# Patient Record
Sex: Female | Born: 1975 | Race: White | Hispanic: No | Marital: Married | Smoking: Never smoker
Health system: Southern US, Community
[De-identification: ages and names within clinical notes are randomized; demographics above are authoritative.]

## PROBLEM LIST (undated history)

## (undated) DIAGNOSIS — R011 Cardiac murmur, unspecified: Secondary | ICD-10-CM

## (undated) DIAGNOSIS — F419 Anxiety disorder, unspecified: Secondary | ICD-10-CM

## (undated) DIAGNOSIS — M1990 Unspecified osteoarthritis, unspecified site: Secondary | ICD-10-CM

## (undated) HISTORY — DX: Anxiety disorder, unspecified: F41.9

## (undated) HISTORY — DX: Cardiac murmur, unspecified: R01.1

## (undated) HISTORY — DX: Unspecified osteoarthritis, unspecified site: M19.90

---

## 2000-06-04 ENCOUNTER — Other Ambulatory Visit: Admission: RE | Admit: 2000-06-04 | Discharge: 2000-06-04 | Payer: Self-pay | Admitting: Gynecology

## 2001-05-28 ENCOUNTER — Other Ambulatory Visit: Admission: RE | Admit: 2001-05-28 | Discharge: 2001-05-28 | Payer: Self-pay | Admitting: Gynecology

## 2002-06-03 ENCOUNTER — Other Ambulatory Visit: Admission: RE | Admit: 2002-06-03 | Discharge: 2002-06-03 | Payer: Self-pay | Admitting: Gynecology

## 2003-09-14 ENCOUNTER — Other Ambulatory Visit: Admission: RE | Admit: 2003-09-14 | Discharge: 2003-09-14 | Payer: Self-pay | Admitting: Gynecology

## 2003-11-13 ENCOUNTER — Ambulatory Visit (HOSPITAL_COMMUNITY): Admission: RE | Admit: 2003-11-13 | Discharge: 2003-11-13 | Payer: Self-pay | Admitting: Gynecology

## 2005-01-30 ENCOUNTER — Other Ambulatory Visit: Admission: RE | Admit: 2005-01-30 | Discharge: 2005-01-30 | Payer: Self-pay | Admitting: Gynecology

## 2005-09-22 ENCOUNTER — Inpatient Hospital Stay (HOSPITAL_COMMUNITY): Admission: AD | Admit: 2005-09-22 | Discharge: 2005-09-22 | Payer: Self-pay | Admitting: Gynecology

## 2005-09-22 ENCOUNTER — Ambulatory Visit: Payer: Self-pay | Admitting: Obstetrics and Gynecology

## 2005-09-23 ENCOUNTER — Inpatient Hospital Stay (HOSPITAL_COMMUNITY): Admission: AD | Admit: 2005-09-23 | Discharge: 2005-09-23 | Payer: Self-pay | Admitting: Gynecology

## 2005-09-25 ENCOUNTER — Ambulatory Visit: Payer: Self-pay | Admitting: *Deleted

## 2005-09-29 ENCOUNTER — Ambulatory Visit: Payer: Self-pay | Admitting: *Deleted

## 2005-10-02 ENCOUNTER — Ambulatory Visit: Payer: Self-pay | Admitting: *Deleted

## 2005-10-06 ENCOUNTER — Encounter (INDEPENDENT_AMBULATORY_CARE_PROVIDER_SITE_OTHER): Payer: Self-pay | Admitting: *Deleted

## 2005-10-06 ENCOUNTER — Ambulatory Visit: Payer: Self-pay | Admitting: Obstetrics & Gynecology

## 2005-10-06 ENCOUNTER — Inpatient Hospital Stay (HOSPITAL_COMMUNITY): Admission: AD | Admit: 2005-10-06 | Discharge: 2005-10-10 | Payer: Self-pay | Admitting: Gynecology

## 2005-11-17 ENCOUNTER — Other Ambulatory Visit: Admission: RE | Admit: 2005-11-17 | Discharge: 2005-11-17 | Payer: Self-pay | Admitting: Gynecology

## 2006-12-04 ENCOUNTER — Other Ambulatory Visit: Admission: RE | Admit: 2006-12-04 | Discharge: 2006-12-04 | Payer: Self-pay | Admitting: Gynecology

## 2006-12-28 ENCOUNTER — Encounter: Admission: RE | Admit: 2006-12-28 | Discharge: 2006-12-28 | Payer: Self-pay | Admitting: Gastroenterology

## 2007-09-09 ENCOUNTER — Encounter: Admission: RE | Admit: 2007-09-09 | Discharge: 2007-09-09 | Payer: Self-pay | Admitting: Gastroenterology

## 2008-02-25 ENCOUNTER — Other Ambulatory Visit: Admission: RE | Admit: 2008-02-25 | Discharge: 2008-02-25 | Payer: Self-pay | Admitting: Gynecology

## 2008-12-14 IMAGING — CT CT ABDOMEN W/ CM
2 of 5 series · 17 of 46 positions shown, 19 images · IV contrast (READICAT/WATER & [ID] OMNI 300)
Comparison: none

CLINICAL DATA: Abdominal pain. 
ABDOMEN CT WITH CONTRAST:
TECHNIQUE: Multidetector CT imaging of the abdomen was performed following the standard protocol during bolus administration of intravenous contrast.
Contrast:  100 cc Omnipaque 300

[Series 3: routine abdomen · axial · 0.70mm/px · z∈[-262,-7]mm · 14 of 59 slices shown, 16 images]
[im 4/59  soft-tissue]
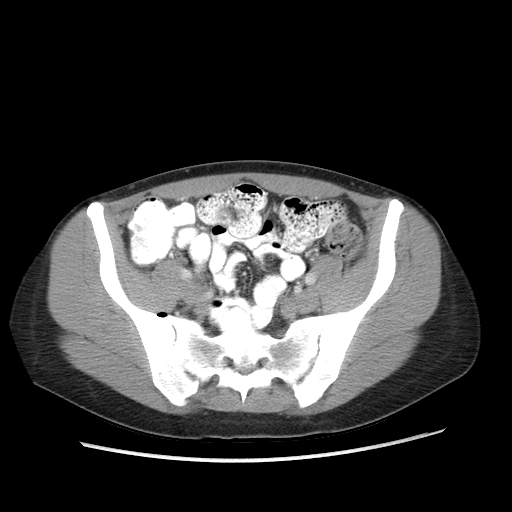
[im 4/59  bone]
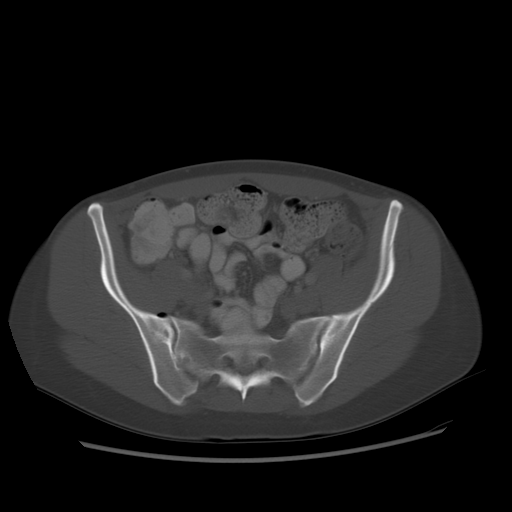
[im 7/59  soft-tissue]
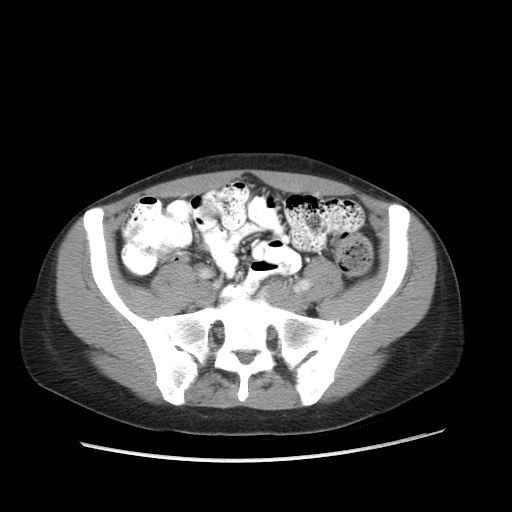
[im 11/59  soft-tissue]
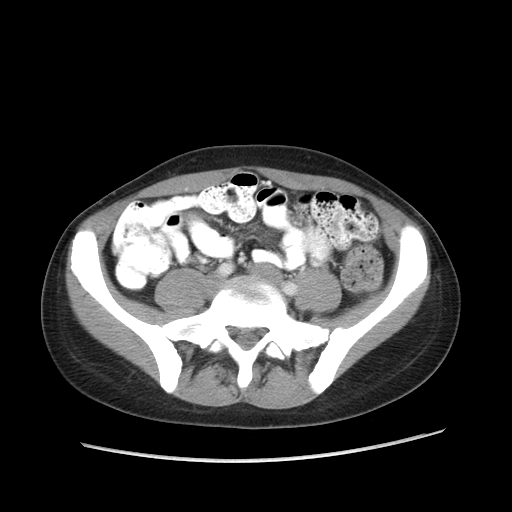
[im 18/59  soft-tissue]
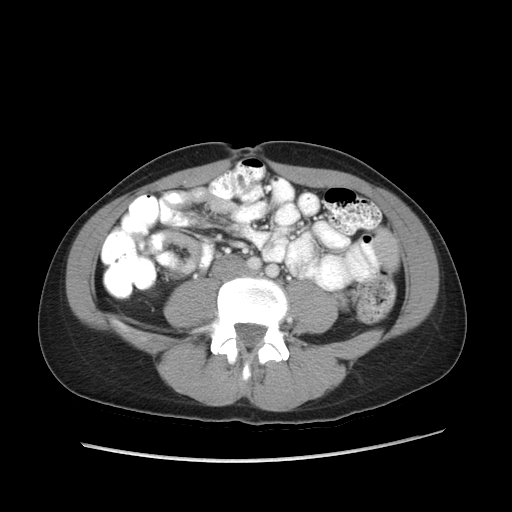
[im 21/59  soft-tissue]
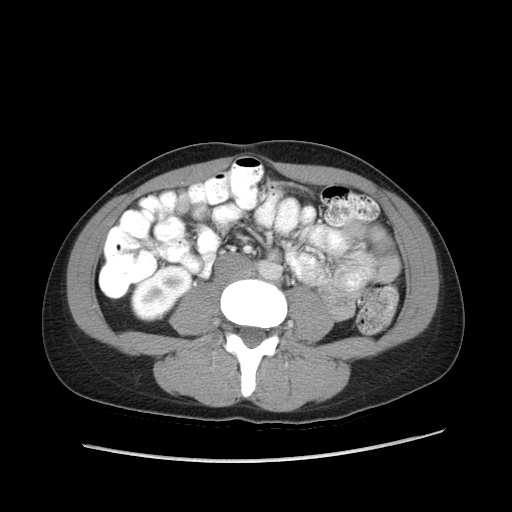
[im 24/59  soft-tissue]
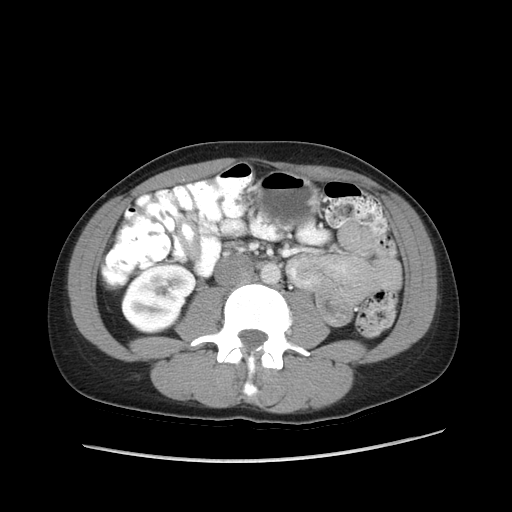
[im 28/59  soft-tissue]
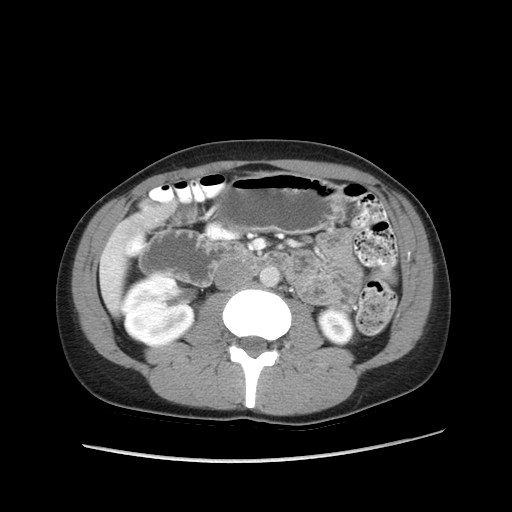
[im 31/59  soft-tissue]
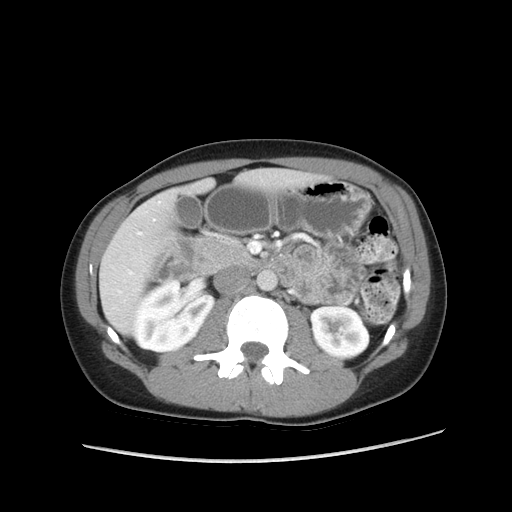
[im 35/59  soft-tissue]
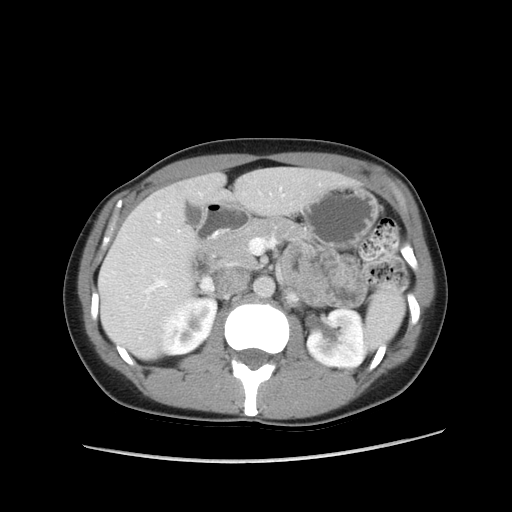
[im 35/59  bone]
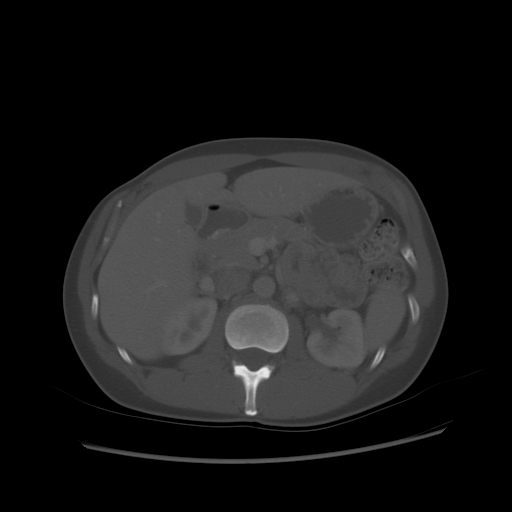
[im 38/59  soft-tissue]
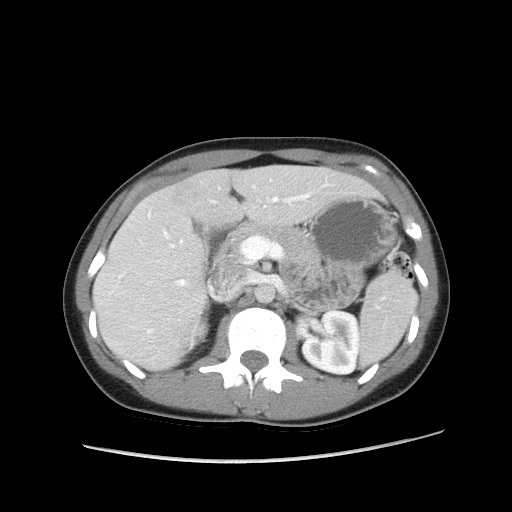
[im 45/59  soft-tissue]
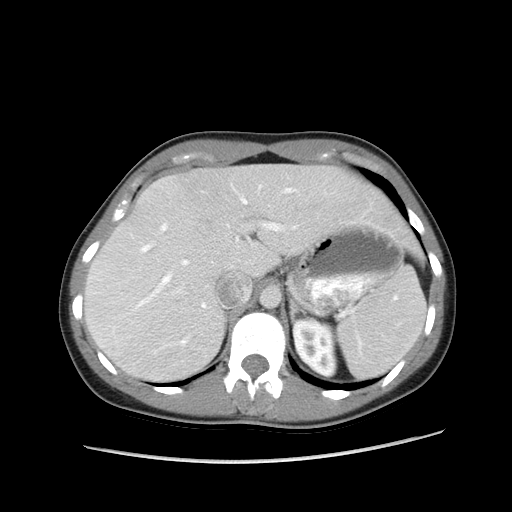
[im 48/59  soft-tissue]
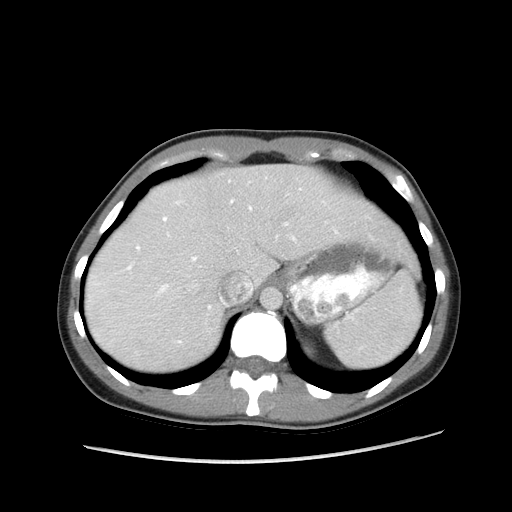
[im 52/59  soft-tissue]
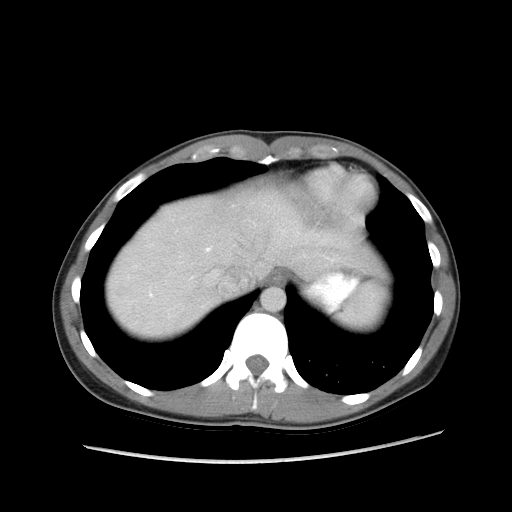
[im 55/59  soft-tissue]
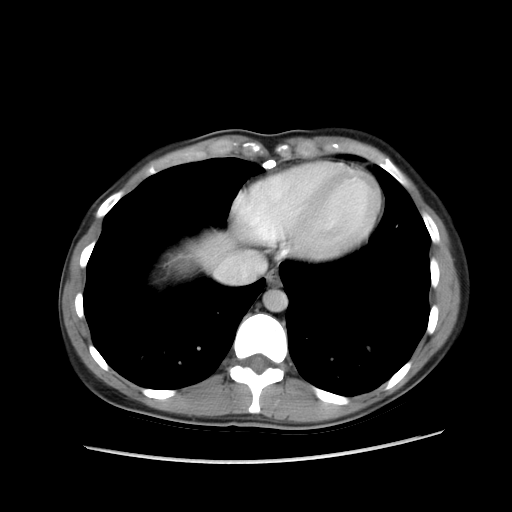

[Series 602: sagittal body · sagittal · 0.70mm/px · 3 of 138 slices shown]
[im 46/138  soft-tissue]
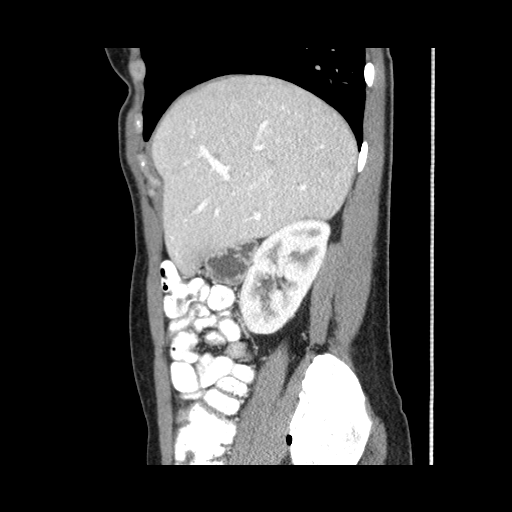
[im 61/138  soft-tissue]
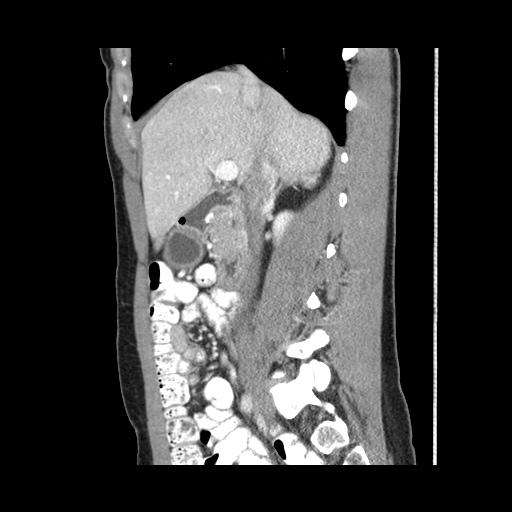
[im 77/138  soft-tissue]
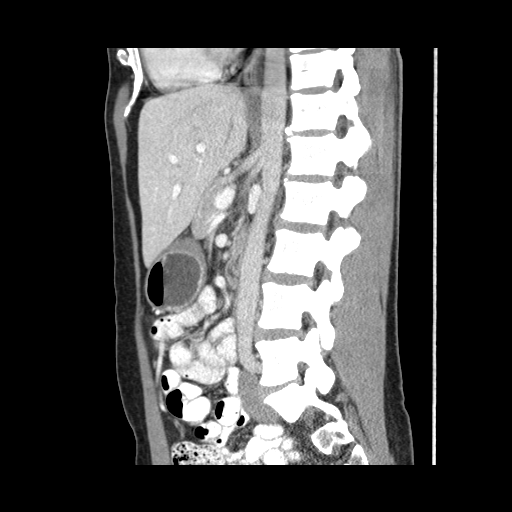

[17 of 46 positions shown; findings below may reference images not displayed]

FINDINGS: The lung bases are clear.  The liver enhances and there is only a single vague area of inhomogeneous enhancement in the left lobe anterior to the portal vein.  There appear to be vessels coursing through this region and I would favor this representing geographic fatty infiltration.  No abnormality was seen at this site by ultrasound in December 2006.  No calcified gallstones are seen.  Pancreas is normal in size and configuration and the pancreatic duct is not dilated.  The adrenal glands and spleen appear normal.  The duodenal loop is somewhat prominent as the duodenum courses behind the SMA and SMV, but I believe this is probably related to peristaltic motion.  The kidneys enhance and on delayed images the pelvocaliceal systems appear normal.  The abdominal aorta is normal in caliber.  No bony abnormality is seen.
IMPRESSION: Negative CT of the abdomen.  There is a vague area of lower attenuation in the left lobe of liver that has vessels coursing through it, most consistent with  focal fatty infiltration.

## 2009-03-06 ENCOUNTER — Encounter: Payer: Self-pay | Admitting: Gynecology

## 2009-03-06 ENCOUNTER — Ambulatory Visit: Payer: Self-pay | Admitting: Gynecology

## 2009-03-06 ENCOUNTER — Other Ambulatory Visit: Admission: RE | Admit: 2009-03-06 | Discharge: 2009-03-06 | Payer: Self-pay | Admitting: Gynecology

## 2009-04-27 ENCOUNTER — Ambulatory Visit: Payer: Self-pay | Admitting: Gynecology

## 2010-03-13 ENCOUNTER — Ambulatory Visit: Payer: Self-pay | Admitting: Gynecology

## 2010-03-13 ENCOUNTER — Other Ambulatory Visit: Admission: RE | Admit: 2010-03-13 | Discharge: 2010-03-13 | Payer: Self-pay | Admitting: Gynecology

## 2010-05-29 ENCOUNTER — Ambulatory Visit: Payer: Self-pay | Admitting: Internal Medicine

## 2010-05-29 ENCOUNTER — Ambulatory Visit (HOSPITAL_COMMUNITY): Admission: RE | Admit: 2010-05-29 | Discharge: 2010-05-29 | Payer: Self-pay | Admitting: Cardiology

## 2010-05-29 ENCOUNTER — Encounter (INDEPENDENT_AMBULATORY_CARE_PROVIDER_SITE_OTHER): Payer: Self-pay | Admitting: *Deleted

## 2010-11-12 ENCOUNTER — Other Ambulatory Visit: Payer: Self-pay | Admitting: Dermatology

## 2011-03-14 NOTE — H&P (Signed)
NAMECHRISSA, MEETZE               ACCOUNT NO.:  [id number]   MEDICAL RECORD NO.:  [id number]           PATIENT TYPE:   LOCATION:                                 FACILITY:   PHYSICIAN:  Juan H. Lily Peer, M.D.     DATE OF BIRTH:   DATE OF ADMISSION:  DATE OF DISCHARGE:                                HISTORY & PHYSICAL   HISTORY:  The patient is a 35 year old gravida 1, para 0 at 32-6/[redacted] weeks  gestation.  Was sent to [facility] for NST secondary to the fact that  she has twin gestation.  NST on both twins were reactive but patient was  found to be contracting every six to eight minutes apart.  She was given IV  fluids consisting of 500 mL bolus of LR followed by 200 mL/hour and she was  given terbutaline 0.25 subcutaneous.  The second dose was administered  approximately an hour later with subsided contractions, although some  irritability was still present.  Good reactivity on both twins.  Her cervix  was long, closed, and posterior.  Urinalysis was negative.  Her blood  pressure was 115/78, pulse 67, temperature 98.6, and respirations were 18.  Pelvic examination:  Her cervix was long, closed, and posterior.  Patient  will be receiving a dose of betamethasone 12.5 mg IM now and to be repeated  again in 24 hours in the event of early delivery before [redacted] weeks gestation.  Patient will be started on terbutaline 2.5 mg p.o. q.4h. and has a follow-up  appointment in a few days in the office.  Labor instructions were provided.      Juan H. Lily Peer, M.D.  Electronically Signed     JHF/MEDQ  D:  09/22/2005  T:  09/22/2005  Job:  [id number]

## 2011-03-14 NOTE — Op Note (Signed)
[name], [name]               ACCOUNT NO.:  [id number]   MEDICAL RECORD NO.:  [id number]          PATIENT TYPE:  INP   LOCATION:  9111                          [facility]ILITY[facility]   PHYSICIAN:  Ivor Costa. Farrel Gobble, M.D. DATE OF BIRTH:  1976/01/30   DATE OF PROCEDURE:  10/06/2005  DATE OF DISCHARGE:                                 OPERATIVE REPORT   PREOPERATIVE DIAGNOSES:  1.  Twin gestation at 35+ weeks.  2.  Early preeclampsia.  3.  Unfavorable cervix.   POSTOPERATIVE DIAGNOSES:  1.  Twin gestation at 35+ weeks.  2.  Early preeclampsia.  3.  Unfavorable cervix.   OPERATION/PROCEDURE:  Primary cesarean section, low flap, transverse.   SURGEON:  Ivor Costa. Farrel Gobble, M.D.   ANESTHESIA:  Spinal.   FLUIDS REPLACED:  1100 mL lactated Ringer's.   ESTIMATED BLOOD LOSS:  1000 mL.   URINE OUTPUT:  150 mL clear urine.   FINDINGS:  Viable female in the vertex presentation, Apgars 9 and 9, birth  weight 4 pounds 10 ounces.  Baby B was a viable female in the vertex  presentation, Apgars of 9 and 9, birth weight 5 pounds 5 ounces.  Clear  amniotic fluid.  Normal uterus, tubes and ovaries.   COMPLICATIONS:  None.   PATHOLOGY:  Placenta with a clamp on A.   DESCRIPTION OF PROCEDURE:  The patient was taken to the operating room,  spinal anesthesia was induced.  The patient was placed in the supine  position with left lateral displacement, prepped and draped in the usual  sterile fashion.  After adequate anesthesia was insured, a Pfannenstiel skin  incision was made with scalpel and carried to the underlying layer of fascia  with electrocautery.  The fascia was scored in the midline.  Incision was  extended laterally with the Bovie.  The inferior aspect of the fascial  incision was grasped with the Kochers.  Underlying rectus muscles were  dissected off with blunt and sharp dissection.  In similar fashion, the  superior aspect of incision was grasped with Kochers and the rectus muscles  were separated off.  The rectus muscles were naturally separated in the  midline.  The peritoneum was identified and entered sharply.  The peritoneal  incision was entered superiorly and inferiorly.  Good visualization of the  underlying bowel and bladder.  The orientation of the uterus is confirmed.  The bladder blade was reinserted and the vesicouterine peritoneum was  identified, tented up and entered sharply with the Metzenbaum scissors.  Bladder flap was created digitally.  Bladder blade was then inserted and the  lower uterine segment was incised in a transverse fashion with the scalpel.  Clear amniotic fluid was noted upon entering the cavity.  The incision was  extended bluntly.  Baby A was brought to the incision and delivered with the  aid of Baby Elliots for a viable female.  Cord was cut and clamped and the  infant handed off to the waiting pediatricians after which the vertex of the  baby was brought into the incision and amniotomy was performed and similarly  was delivered with the aid of Baby Elliots.  Cord was clamped and cut.  Cord  bloods were obtained.  The uterus was massaged and placenta was allowed to  separate naturally.  The uterus was then cleared of all clots and debris.  The uterine incision was repaired with running locked layer of 0 chromic and  a second suture was used for imbrication.  The pelvis was then irrigated  with copious amounts of warm saline.  The adnexa were inspected and noted to  be unremarkable.  The peritoneum, fascia and muscle were also inspected and  noted to be hemostatic.  Fascia was then closed with 0 Vicryl in a running  fashion.  Subcutaneous was irrigated and reapproximated with 2-0 plain.  The  skin was closed with 4-0 Vicryl on the [place].  Dermabond was placed over the  incision.  The patient tolerated the procedure well.  Sponge, lap and needle  counts were correct x2.  She was transferred to the PACU in stable  condition.       Ivor Costa. Farrel Gobble, M.D.  Electronically Signed     THL/MEDQ  D:  10/06/2005  T:  10/07/2005  Job:  [id number]

## 2011-03-14 NOTE — H&P (Signed)
[name], [name]               ACCOUNT NO.:  [id number]   MEDICAL RECORD NO.:  [id number]           PATIENT TYPE:   LOCATION:                                 FACILITY:   PHYSICIAN:  Ivor Costa. Farrel Gobble, M.D.      DATE OF BIRTH:   DATE OF ADMISSION:  DATE OF DISCHARGE:                                HISTORY & PHYSICAL   CHIEF COMPLAINT:  Elevated blood pressure at 35 plus weeks with twins.   HISTORY OF PRESENT ILLNESS:  The patient is a 35 year old G1 with an  estimated date of confinement of November 09, 2004, estimated gestational age  of 73 and 2/7ths weeks, who has been followed with NSTs biweekly for preterm  labor, as well as twin gestation.  The patient had presented today to the  hospital without any complaints, and was noted to have a blood pressure of  136/95 and a follow up of 155/100.  Upon presentation to the office today,  her blood pressure is 140/100 with a follow up of 150/100.  She is without  any complaints.  She has no headaches, visual changes, nausea, vomiting,  epigastric pain, and no extremity swelling.  The patient does report some  contractions.  Her pregnancy is complicated by twin pregnancy, for which she  has a female and female with good interval growth.  The last ultrasound was  done about a week ago which shows baby A to be in the 27th percentile with  an estimated weight of 2144, and baby B to be in the 30th percentile with an  estimated weight of 2254, with normal fluid pocket for both.  Refer to the  [place].  She is A positive, antibody negative, RPR nonreactive, rubella  negative, hepatitis B surface antigen non-reactive, HIV nonreactive.  GBS  was done today.  Refer to the [place].   PHYSICAL EXAMINATION:  GENERAL:  She is a well-appearing gravida in no acute  distress.  VITAL SIGNS:  Her blood pressure was 150/100.  HEART:  Regular rate.  LUNGS:  Clear to auscultation.  ABDOMEN:  Gravid, soft, and nontender.  VAGINAL EXAM:  She was  fingertip, soft, and posterior.  EXTREMITIES:  No edema, no clonus, and normal reflexes.   ASSESSMENT:  Thirty-five plus weeks with elevated blood pressure.  We will  go ahead and check PIH labs at [facility] and plan for delivery today.  All questions were addressed.      Ivor Costa. Farrel Gobble, M.D.  Electronically Signed     THL/MEDQ  D:  10/06/2005  T:  10/06/2005  Job:  [id number]

## 2011-03-14 NOTE — Discharge Summary (Signed)
[name], [name]               ACCOUNT NO.:  [id number]   MEDICAL RECORD NO.:  [id number]          PATIENT TYPE:  INP   LOCATION:                                [facility]   PHYSICIAN:  Timothy P. Fontaine, M.D.DATE OF BIRTH:  [birth year]   DATE OF ADMISSION:  10/06/2005  DATE OF DISCHARGE:  10/10/2005                                 DISCHARGE SUMMARY   DISCHARGE DIAGNOSES:  1.  Twin gestation 35 weeks, delivered.  2.  Preeclampsia.  3.  Unfavorable cervix.   PROCEDURE:  Primary low transverse cervical cesarean section October 06, 2005, Dr. Douglass Rivers.   HOSPITAL COURSE:  A 35 year old, G1 P0, at 43 weeks' twin gestation, who  enters with elevated blood pressures.  The patient was noted to have a blood  pressures in the 136/95 to 155/100 range and on further evaluation was also  found to have proteinuria.  The patient's exam was unfavorable.  Discussions  for serial induction versus primary cesarean section were reviewed and the  patient elected for cesarean section.  The patient underwent an  uncomplicated primary cesarean section, October 06, 2005, producing twin A  normal female, Apgars 9 and 9, weight 4 pounds 10 ounces.  Twin B normal  female, Apgars 9 and 9, weight 5 pounds 5 ounces.  The patient's postoperative  course was uncomplicated, and she was discharged on postoperative day #4,  ambulating well, tolerating a regular diet with a postoperative hemoglobin  of 9.6.  The patient's blood pressures were noted to normalize following  delivery in the 110 over 60-70 range.  The patient was given  precautions/instructions and follow-up.   The patient will be seen in the office in six weeks following delivery.   Received a prescription for Tylox, #30, one to two p.o. every six hours  p.r.n. pain.   The patient's rubella titer is negative.  She is instructed to receive a  rubella vaccine before discharge.  Her blood type is A positive.      Timothy P. Fontaine,  M.D.  Electronically Signed     TPF/MEDQ  D:  10/10/2005  T:  10/11/2005  Job:  [id number]

## 2011-03-20 ENCOUNTER — Other Ambulatory Visit: Payer: Self-pay | Admitting: Gynecology

## 2011-03-20 ENCOUNTER — Encounter (INDEPENDENT_AMBULATORY_CARE_PROVIDER_SITE_OTHER): Payer: 59 | Admitting: Gynecology

## 2011-03-20 ENCOUNTER — Other Ambulatory Visit (HOSPITAL_COMMUNITY)
Admission: RE | Admit: 2011-03-20 | Discharge: 2011-03-20 | Disposition: A | Discharge status: Home / Self Care | Payer: 59 | Source: Ambulatory Visit | Attending: Gynecology | Admitting: Gynecology

## 2011-03-20 DIAGNOSIS — Z13220 Encounter for screening for lipoid disorders: Secondary | ICD-10-CM

## 2011-03-20 DIAGNOSIS — Z01419 Encounter for gynecological examination (general) (routine) without abnormal findings: Secondary | ICD-10-CM

## 2011-03-20 DIAGNOSIS — Z124 Encounter for screening for malignant neoplasm of cervix: Secondary | ICD-10-CM | POA: Insufficient documentation

## 2011-03-27 ENCOUNTER — Ambulatory Visit (INDEPENDENT_AMBULATORY_CARE_PROVIDER_SITE_OTHER): Payer: 59 | Admitting: Gynecology

## 2011-03-27 DIAGNOSIS — Z30431 Encounter for routine checking of intrauterine contraceptive device: Secondary | ICD-10-CM

## 2011-04-25 ENCOUNTER — Ambulatory Visit (INDEPENDENT_AMBULATORY_CARE_PROVIDER_SITE_OTHER): Payer: 59 | Admitting: Gynecology

## 2011-04-25 DIAGNOSIS — Z30431 Encounter for routine checking of intrauterine contraceptive device: Secondary | ICD-10-CM

## 2012-12-28 ENCOUNTER — Other Ambulatory Visit: Payer: Self-pay | Admitting: Dermatology

## 2013-05-03 ENCOUNTER — Other Ambulatory Visit: Payer: Self-pay | Admitting: Dermatology

## 2013-06-20 ENCOUNTER — Ambulatory Visit (INDEPENDENT_AMBULATORY_CARE_PROVIDER_SITE_OTHER): Payer: BC Managed Care – PPO | Admitting: Women's Health

## 2013-06-20 ENCOUNTER — Encounter: Payer: Self-pay | Admitting: Women's Health

## 2013-06-20 ENCOUNTER — Other Ambulatory Visit (HOSPITAL_COMMUNITY)
Admission: RE | Admit: 2013-06-20 | Discharge: 2013-06-20 | Disposition: A | Discharge status: Home / Self Care | Payer: BC Managed Care – PPO | Source: Ambulatory Visit | Attending: Gynecology | Admitting: Gynecology

## 2013-06-20 VITALS — BP 114/74 | Ht 66.0 in | Wt 132.0 lb

## 2013-06-20 DIAGNOSIS — E079 Disorder of thyroid, unspecified: Secondary | ICD-10-CM

## 2013-06-20 DIAGNOSIS — Z13220 Encounter for screening for lipoid disorders: Secondary | ICD-10-CM

## 2013-06-20 DIAGNOSIS — Z01419 Encounter for gynecological examination (general) (routine) without abnormal findings: Secondary | ICD-10-CM | POA: Insufficient documentation

## 2013-06-20 DIAGNOSIS — Z975 Presence of (intrauterine) contraceptive device: Secondary | ICD-10-CM

## 2013-06-20 LAB — CBC WITH DIFFERENTIAL/PLATELET
Basophils Relative: 1 % (ref 0–1)
Eosinophils Absolute: 0.1 10*3/uL (ref 0.0–0.7)
Eosinophils Relative: 1 % (ref 0–5)
MCH: 29.8 pg (ref 26.0–34.0)
MCHC: 34.2 g/dL (ref 30.0–36.0)
MCV: 87.1 fL (ref 78.0–100.0)
Monocytes Relative: 6 % (ref 3–12)
Neutrophils Relative %: 55 % (ref 43–77)
Platelets: 324 10*3/uL (ref 150–400)

## 2013-06-20 NOTE — Progress Notes (Signed)
[name] [birth year] [id number]    History:    The patient presents for annual exam.  Mirena IUD placed 02/2011 with minimal bleeding. Normal Pap history. Zoloft 25 with good relief of anxiety/depression symptoms.   Past medical history, past surgical history, family history and social history were all reviewed and documented in the EPIC chart. 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 Twins [place] and [place] 7-1/2 and doing well. Chyrl Civatte has questionable endocrine problem with precocious puberty. Parents hypertension. Paternal aunt breast cancer.   ROS:  A  ROS was performed and pertinent positives and negatives are included in the history.  Exam:  Filed Vitals:   06/20/13 1520  BP: 114/74    General appearance:  Normal Head/Neck:  Normal, without cervical or supraclavicular adenopathy. Thyroid:  Symmetrical, normal in size, without palpable masses or nodularity. Respiratory  Effort:  Normal  Auscultation:  Clear without wheezing or rhonchi Cardiovascular  Auscultation:  Regular rate, without rubs, murmurs or gallops  Edema/varicosities:  Not grossly evident Abdominal  Soft,nontender, without masses, guarding or rebound.  Liver/spleen:  No organomegaly noted  Hernia:  None appreciated  Skin  Inspection:  Grossly normal  Palpation:  Grossly normal Neurologic/psychiatric  Orientation:  Normal with appropriate conversation.  Mood/affect:  Normal  Genitourinary    Breasts: Examined lying and sitting.     Right: Without masses, retractions, discharge or axillary adenopathy.     Left: Without masses, retractions, discharge or axillary adenopathy.   Inguinal/mons:  Normal without inguinal adenopathy  External genitalia:  Normal  BUS/Urethra/Skene's glands:  Normal  Bladder:  Normal  Vagina:  Normal  Cervix:  Normal IUD strings visible  Uterus:  normal in size, shape and contour.  Midline and mobile  Adnexa/parametria:     Rt: Without masses or  tenderness.   Lt: Without masses or tenderness.  Anus and perineum: Normal  Digital rectal exam: Normal sphincter tone without palpated masses or tenderness  Assessment/Plan:  37 y.o. M. WF G1 P2 for annual exam with no complaints.  Mirena IUD 02/2011 Depression stable on Zoloft 25 mg  Plan:.   Zoloft 25, prescription, proper use given and reviewed, reviewed very low-dose states keep symptoms manageable. Denies need for counseling at this time. SBE's, screening mammogram, annual screen at 38 reviewed. Continue regular exercise, calcium rich diet, vitamin D 1000 daily. CBC, TSH, lipid panel, UA, Pap. Pap normal 2012, new screening guidelines reviewed.Marland Kitchen    Harrington Challenger [facility], 5:12 PM 06/20/2013

## 2013-06-20 NOTE — Patient Instructions (Signed)

## 2013-06-21 LAB — LIPID PANEL
Cholesterol: 155 mg/dL (ref 0–200)
Total CHOL/HDL Ratio: 3.2 Ratio
VLDL: 25 mg/dL (ref 0–40)

## 2013-06-21 LAB — URINALYSIS W MICROSCOPIC + REFLEX CULTURE
Bilirubin Urine: NEGATIVE
Crystals: NONE SEEN
Leukocytes, UA: NEGATIVE
Protein, ur: NEGATIVE mg/dL
Specific Gravity, Urine: 1.01 (ref 1.005–1.030)
Squamous Epithelial / LPF: NONE SEEN
Urobilinogen, UA: 0.2 mg/dL (ref 0.0–1.0)

## 2013-06-28 ENCOUNTER — Encounter: Payer: Self-pay | Admitting: Gynecology

## 2014-02-23 ENCOUNTER — Encounter: Payer: Self-pay | Admitting: Sports Medicine

## 2014-02-23 ENCOUNTER — Ambulatory Visit (INDEPENDENT_AMBULATORY_CARE_PROVIDER_SITE_OTHER): Payer: Managed Care, Other (non HMO) | Admitting: Sports Medicine

## 2014-02-23 VITALS — BP 105/71 | HR 52 | Ht 66.0 in | Wt 130.0 lb

## 2014-02-23 DIAGNOSIS — M76899 Other specified enthesopathies of unspecified lower limb, excluding foot: Secondary | ICD-10-CM

## 2014-02-23 DIAGNOSIS — M7070 Other bursitis of hip, unspecified hip: Secondary | ICD-10-CM

## 2014-02-24 ENCOUNTER — Encounter: Payer: Self-pay | Admitting: Sports Medicine

## 2014-02-24 NOTE — Progress Notes (Signed)
   Subjective:    Patient ID: [name], female    DOB: 1976/07/03, 38 y.o.   MRN: [id number]  HPI chief complaint: Right hip pain  Very pleasant 38 year old female comes in today complaining of 2-3 years of right hip pain. No trauma that she can recall but a rather sudden onset of pain that initially began after running. She believes this was back in either 2012 or 2013. She was evaluated by a local orthopedist who ordered an MRI of her right hip without contrast. That MRI suggested a possible labral tear. However, the patient tells me that she was given an injection in her lateral hip for bursitis. Her pain initially was diffuse in the hip and the lateral hip pain did improve with this injection but she had persistent groin pain. This eventually led to an MRI arthrogram of her hip done in December of 2014. That study showed no evidence of a labral tear nor was there any evidence of femoral acetabular impingement or significant degenerative changes. Main finding was some fluid in the right iliopsoas bursa. At the time of the MRI arthrogram, an intra-articular cortisone injection was also administered but this provided her with absolutely no pain relief (not even initially). She is continue to struggle with hip pain with activity. She has been unable to arrive or bike without pain. Pain is present at the end of activity as well as the following day. She describes a pulling and catching sensation deep within the right groin. She gets similar pain in the left hip as well although not as severe. She denies low back pain. She denies radiating pain down the leg. She denies any numbness or tingling. No prior hip surgeries. Both MRIs are available for my review today. In addition to the cortisone injections the patient did undergo some initial physical therapy.  Past medical history reviewed. She is otherwise healthy. No known drug allergies Denies alcohol or tobacco use    Review of Systems       Objective:   Physical Exam Well-developed, well-nourished. No acute distress. Awake alert and oriented x3. Vital signs reviewed.  Right hip: Smooth painless hip range of motion with a negative log roll. Pain with FADIR. No pain with FABER. There is no bony or soft tissue tenderness to direct palpation. Negative Thomas test. Good hamstring flexibility but a tight IT band. Bilateral hip abductor weakness. Negative straight leg raise. No focal neurological deficits of either lower extremity. Patient has about a centimeter leg length discrepancy, left leg shorter than the right. Evaluation of her running form shows a neutral foot strike but dynamic genu valgus. She is able to run without a limp.  MRI scans are as above. I personally reviewed the MRI arthrogram.       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 I've also given her some IT band stretches and crossover stretches. Patient is asking about what types of activities she should try. I recommended that she initially try swimming and elliptical but I also reassured her that there is no structural damage in her hip so all of her activity is limited by pain only. She can use prn NSAIDs. I've also given her a 3/16 inch heel lift for her shorter left leg and she will followup with me in 4 weeks. If she continues to struggle, I may consider a brief return back to formal physical therapy.

## 2014-03-15 ENCOUNTER — Ambulatory Visit: Payer: 59 | Admitting: Sports Medicine

## 2014-03-27 ENCOUNTER — Ambulatory Visit: Payer: Managed Care, Other (non HMO) | Admitting: Sports Medicine

## 2014-04-10 ENCOUNTER — Ambulatory Visit (INDEPENDENT_AMBULATORY_CARE_PROVIDER_SITE_OTHER): Payer: Managed Care, Other (non HMO) | Admitting: Sports Medicine

## 2014-04-10 ENCOUNTER — Encounter: Payer: Self-pay | Admitting: Sports Medicine

## 2014-04-10 VITALS — BP 101/63 | Ht 67.0 in | Wt 135.0 lb

## 2014-04-10 DIAGNOSIS — M7070 Other bursitis of hip, unspecified hip: Secondary | ICD-10-CM

## 2014-04-10 DIAGNOSIS — M76899 Other specified enthesopathies of unspecified lower limb, excluding foot: Secondary | ICD-10-CM

## 2014-04-10 NOTE — Progress Notes (Signed)
   Subjective:    Patient ID: [name], female    DOB: 06/13/1976, 38 y.o.   MRN: [id number]  HPI Patient comes in today for followup on right hip iliopsoas bursitis. Still getting groin pain with impact exercise such as running. Some pain with swimming and biking as well. She has noticed some benefit with her 3/16 inch heel lift on the left and is requesting more of these. She has been compliant with her home exercises but notes that standing external rotation exercises will reproduce her groin pain.    Review of Systems     Objective:   Physical Exam Well-developed, well-nourished. No acute distress. Awake alert and oriented x3. Vital signs reviewed  Right hip: Still some pain with FADIR testing. Still with some hip abductor weakness as well. Neurovascularly intact distally. Walking without a limp.       Assessment & Plan:  Chronic right hip pain with MRI evidence of iliopsoas as strain/bursitis  I recommend a return to formal physical therapy, this time working with Loni Dolly' Halloran. I really think this is a biomechanical issue and I'm hoping that Jenny Reichmann can help her with this. I discussed the possibility of an iliopsoas bursa injection sometime in the next few weeks. In fact, patient will followup with me in 6 weeks but I've asked her to call me in 3-4 weeks with a progress update and we may consider getting the injection prior to her followup visit with me. I've also given her the name of [employer] to order more heel lifts.

## 2014-05-29 ENCOUNTER — Ambulatory Visit: Payer: Managed Care, Other (non HMO) | Admitting: Sports Medicine

## 2014-06-12 ENCOUNTER — Ambulatory Visit: Payer: Managed Care, Other (non HMO) | Admitting: Sports Medicine

## 2014-11-24 ENCOUNTER — Other Ambulatory Visit: Payer: Self-pay | Admitting: Dermatology

## 2015-04-02 ENCOUNTER — Other Ambulatory Visit: Payer: Self-pay

## 2015-04-02 DIAGNOSIS — Z1231 Encounter for screening mammogram for malignant neoplasm of breast: Secondary | ICD-10-CM

## 2015-04-09 ENCOUNTER — Ambulatory Visit
Admission: RE | Admit: 2015-04-09 | Discharge: 2015-04-09 | Disposition: A | Discharge status: Home / Self Care | Payer: Managed Care, Other (non HMO) | Source: Ambulatory Visit

## 2015-04-09 DIAGNOSIS — Z1231 Encounter for screening mammogram for malignant neoplasm of breast: Secondary | ICD-10-CM

## 2015-11-23 ENCOUNTER — Other Ambulatory Visit (HOSPITAL_COMMUNITY)
Admission: RE | Admit: 2015-11-23 | Discharge: 2015-11-23 | Disposition: A | Discharge status: Home / Self Care | Payer: BLUE CROSS/BLUE SHIELD | Source: Ambulatory Visit | Attending: Women's Health | Admitting: Women's Health

## 2015-11-23 ENCOUNTER — Ambulatory Visit (INDEPENDENT_AMBULATORY_CARE_PROVIDER_SITE_OTHER): Payer: BLUE CROSS/BLUE SHIELD | Admitting: Women's Health

## 2015-11-23 ENCOUNTER — Encounter: Payer: Self-pay | Admitting: Women's Health

## 2015-11-23 VITALS — BP 117/76 | Ht 66.0 in | Wt 129.2 lb

## 2015-11-23 DIAGNOSIS — E038 Other specified hypothyroidism: Secondary | ICD-10-CM | POA: Diagnosis not present

## 2015-11-23 DIAGNOSIS — Z01419 Encounter for gynecological examination (general) (routine) without abnormal findings: Secondary | ICD-10-CM | POA: Insufficient documentation

## 2015-11-23 DIAGNOSIS — Z23 Encounter for immunization: Secondary | ICD-10-CM | POA: Diagnosis not present

## 2015-11-23 DIAGNOSIS — Z13220 Encounter for screening for lipoid disorders: Secondary | ICD-10-CM

## 2015-11-23 DIAGNOSIS — Z1151 Encounter for screening for human papillomavirus (HPV): Secondary | ICD-10-CM | POA: Diagnosis not present

## 2015-11-23 LAB — LIPID PANEL
CHOL/HDL RATIO: 2.9 ratio (ref <=5.0)
Cholesterol: 170 mg/dL (ref 125–200)
HDL: 59 mg/dL (ref >=46)
LDL CALC: 98 mg/dL (ref <130)
Triglycerides: 64 mg/dL (ref <150)
VLDL: 13 mg/dL (ref <30)

## 2015-11-23 LAB — COMPREHENSIVE METABOLIC PANEL
ALK PHOS: 44 U/L (ref 33–115)
ALT: 14 U/L (ref 6–29)
AST: 15 U/L (ref 10–30)
Albumin: 4.5 g/dL (ref 3.6–5.1)
BILIRUBIN TOTAL: 0.6 mg/dL (ref 0.2–1.2)
BUN: 17 mg/dL (ref 7–25)
CALCIUM: 9.5 mg/dL (ref 8.6–10.2)
CO2: 26 mmol/L (ref 20–31)
Chloride: 101 mmol/L (ref 98–110)
Creat: 1.05 mg/dL (ref 0.50–1.10)
GLUCOSE: 72 mg/dL (ref 65–99)
POTASSIUM: 5.2 mmol/L (ref 3.5–5.3)
Sodium: 136 mmol/L (ref 135–146)
Total Protein: 6.9 g/dL (ref 6.1–8.1)

## 2015-11-23 LAB — CBC WITH DIFFERENTIAL/PLATELET
BASOS ABS: 0 10*3/uL (ref 0.0–0.1)
Basophils Relative: 0 % (ref 0–1)
EOS ABS: 0.1 10*3/uL (ref 0.0–0.7)
EOS PCT: 1 % (ref 0–5)
HEMATOCRIT: 44.1 % (ref 36.0–46.0)
Hemoglobin: 14.7 g/dL (ref 12.0–15.0)
Lymphocytes Relative: 34 % (ref 12–46)
Lymphs Abs: 2.6 10*3/uL (ref 0.7–4.0)
MCH: 29.8 pg (ref 26.0–34.0)
MCHC: 33.3 g/dL (ref 30.0–36.0)
MCV: 89.5 fL (ref 78.0–100.0)
MONO ABS: 0.5 10*3/uL (ref 0.1–1.0)
MPV: 9.7 fL (ref 8.6–12.4)
Monocytes Relative: 6 % (ref 3–12)
Neutro Abs: 4.5 10*3/uL (ref 1.7–7.7)
Neutrophils Relative %: 59 % (ref 43–77)
PLATELETS: 321 10*3/uL (ref 150–400)
RBC: 4.93 MIL/uL (ref 3.87–5.11)
RDW: 13.6 % (ref 11.5–15.5)
WBC: 7.6 10*3/uL (ref 4.0–10.5)

## 2015-11-23 LAB — TSH: TSH: 1.046 u[IU]/mL (ref 0.350–4.500)

## 2015-11-23 NOTE — Progress Notes (Signed)
ARNETHA SILVERTHORNE July 26, 1976 [id number]    History:    Presents for annual exam. Amenorrhea, Mirena placed 02/2011 and will be replaced next week by Dr. Phineas Real. Has had no cycles since on Mirena. Reports hands and feet get very cold and blanch white at times, hands can feel very tight, questions if she has Raynaud's syndrome. Normal PAP and Mammogram history.   Past medical history, past surgical history, family history and social history were all reviewed and documented in the EPIC chart.  Married. 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    ROS:  A ROS was performed and pertinent positives and negatives are included.  Exam:  Filed Vitals:   11/23/15 1030  BP: 117/76    General appearance:  Normal Thyroid:  Symmetrical, normal in size, without palpable masses or nodularity. Respiratory  Auscultation:  Clear without wheezing or rhonchi Cardiovascular  Auscultation:  Regular rate, without rubs, murmurs or gallops  Edema/varicosities:  Not grossly evident Abdominal  Soft,nontender, without masses, guarding or rebound.  Liver/spleen:  No organomegaly noted  Hernia:  None appreciated  Skin  Inspection:  Grossly normal   Breasts: Examined lying and sitting.     Right: Without masses, retractions, discharge or axillary adenopathy.     Left: Without masses, retractions, discharge or axillary adenopathy. Gentitourinary   Inguinal/mons:  Normal without inguinal adenopathy  External genitalia:  Normal  BUS/Urethra/Skene's glands:  Normal  Vagina:  Normal  Cervix:  Normal IUD strings visible  Uterus:  Retroverted, normal in size, shape and contour.  Midline and mobile  Adnexa/parametria:     Rt: Without masses or tenderness.   Lt: Without masses or tenderness.  Anus and perineum: Normal  Digital rectal exam: Normal sphincter tone without palpated masses or tenderness  Assessment/Plan:  40 y.o.   MWF G1 P2 for annual exam with no complaints.  Mirena IUD  02/2011-Amenorrhea Mild anxiety, dyspareunia Probable Raynaud's Chronic hip pain evaluated by Orthopedist    Plan:Marland Kitchen Mirena IUD removal/reinsertion next week. Reviewed slight risks of infection, hemorrhage and perforation. Continue SBE's and screening mammograms. Reviewed importance of relaxation, vaginal lubricants with intercourse.  Continue to wear gloves for warmth. Continue regular exercise, avoid exercises that aggravate hip joints, calcium rich diet, vitamin D 1000 daily. CBC, CMP, TSH, lipid panel, UA, Pap with HPV.  Flu vaccine.   Huel Cote [facility], 11:20 AM 11/23/2015

## 2015-11-23 NOTE — Patient Instructions (Addendum)
Raynaud Phenomenon Raynaud phenomenon is a condition that affects the blood vessels (arteries) that carry blood to your fingers and toes. The arteries that supply blood to your ears or the tip of your nose might also be affected. Raynaud phenomenon causes the arteries to temporarily narrow. As a result, the flow of blood to the affected areas is temporarily decreased. This usually occurs in response to cold temperatures or stress. During an attack, the skin in the affected areas turns white. You may also feel tingling or numbness in those areas. Attacks usually last for only a brief period, and then the blood flow to the area returns to normal. In most cases, Raynaud phenomenon does not cause serious health problems. CAUSES  For many people with this condition, the cause is not known. Raynaud phenomenon is sometimes associated with other diseases, such as scleroderma or lupus. RISK FACTORS Raynaud phenomenon can affect anyone, but it develops most often in people who are 65-60 years old. It affects more females than males. SIGNS AND SYMPTOMS Symptoms of Raynaud phenomenon may occur when you are exposed to cold temperatures or when you have emotional stress. The symptoms may last for a few minutes or up to several hours. They usually affect your fingers but may also affect your toes, ears, or the tip of your nose. Symptoms may include:  Changes in skin color. The skin in the affected areas will turn pale or white. The skin may then change from white to bluish to red as normal blood flow returns to the area.  Numbness, tingling, or pain in the affected areas. In severe cases, sores may develop in the affected areas.  DIAGNOSIS  Your health care provider will do a physical exam and take your medical history. You may be asked to put your hands in cold water to check for a reaction to cold temperature. Blood tests may be done to check for other diseases or conditions. Your health care provider may also  order a test to check the movement of blood through your arteries and veins (vascular ultrasound). TREATMENT  Treatment often involves making lifestyle changes and taking steps to control your exposure to cold temperatures. For more severe cases, medicine (calcium channel blockers) may be used to improve blood flow. Surgery is sometimes done to block the nerves that control the affected arteries, but this is rare. HOME CARE INSTRUCTIONS   Avoid exposure to cold by taking these steps:  If possible, stay indoors during cold weather.  When you go outside during cold weather, dress in layers and wear mittens, a hat, a scarf, and warm footwear.  Wear mittens or gloves when handling ice or frozen food.  Use holders for glasses or cans containing cold drinks.  Let warm water run for a while before taking a shower or bath.  Warm up the car before driving in cold weather.  If possible, avoid stressful and emotional situations. Exercise, meditation, and yoga may help you cope with stress. Biofeedback may be useful.  Do not use any tobacco products, including cigarettes, chewing tobacco, or electronic cigarettes. If you need help quitting, ask your health care provider.  Avoid secondhand smoke.  Limit your use of caffeine. Switch to decaffeinated coffee, tea, and soda. Avoid chocolate.  Wear loose fitting socks and comfortable, roomy shoes.  Avoid vibrating tools and machinery.  Take medicines only as directed by your health care provider. SEEK MEDICAL CARE IF:   Your discomfort becomes worse despite lifestyle changes.  You develop sores on  your fingers or toes that do not heal.  Your fingers or toes turn black.  You have breaks in the skin on your fingers or toes.  You have a fever.  You have pain or swelling in your joints.  You have a rash.  Your symptoms occur on only one side of your body.   This information is not intended to replace advice given to you by your health  care provider. Make sure you discuss any questions you have with your health care provider.   Document Released: 10/10/2000 Document Revised: 11/03/2014 Document Reviewed: 05/18/2014 Elsevier Interactive Patient Education 2016 [place] Maintenance, Female Adopting a healthy lifestyle and getting preventive care can go a long way to promote health and wellness. Talk with your health care provider about what schedule of regular examinations is right for you. This is a good chance for you to check in with your provider about disease prevention and staying healthy. In between checkups, there are plenty of things you can do on your own. Experts have done a lot of research about which lifestyle changes and preventive measures are most likely to keep you healthy. Ask your health care provider for more information. WEIGHT AND DIET  Eat a healthy diet  Be sure to include plenty of vegetables, fruits, low-fat dairy products, and lean protein.  Do not eat a lot of foods high in solid fats, added sugars, or salt.  Get regular exercise. This is one of the most important things you can do for your health.  Most adults should exercise for at least 150 minutes each week. The exercise should increase your heart rate and make you sweat (moderate-intensity exercise).  Most adults should also do strengthening exercises at least twice a week. This is in addition to the moderate-intensity exercise.  Maintain a healthy weight  Body mass index (BMI) is a measurement that can be used to identify possible weight problems. It estimates body fat based on height and weight. Your health care provider can help determine your BMI and help you achieve or maintain a healthy weight.  For females 38 years of age and older:   A BMI below 18.5 is considered underweight.  A BMI of 18.5 to 24.9 is normal.  A BMI of 25 to 29.9 is considered overweight.  A BMI of 30 and above is considered obese.  Watch levels  of cholesterol and blood lipids  You should start having your blood tested for lipids and cholesterol at 42 years of age, then have this test every 5 years.  You may need to have your cholesterol levels checked more often if:  Your lipid or cholesterol levels are high.  You are older than 42 years of age.  You are at high risk for heart disease.  CANCER SCREENING   Lung Cancer  Lung cancer screening is recommended for adults 21-30 years old who are at high risk for lung cancer because of a history of smoking.  A yearly low-dose CT scan of the lungs is recommended for people who:  Currently smoke.  Have quit within the past 15 years.  Have at least a 30-pack-year history of smoking. A pack year is smoking an average of one pack of cigarettes a day for 1 year.  Yearly screening should continue until it has been 15 years since you quit.  Yearly screening should stop if you develop a health problem that would prevent you from having lung cancer treatment.  Breast Cancer  Practice breast  self-awareness. This means understanding how your breasts normally appear and feel.  It also means doing regular breast self-exams. Let your health care provider know about any changes, no matter how small.  If you are in your 20s or 30s, you should have a clinical breast exam (CBE) by a health care provider every 1-3 years as part of a regular health exam.  If you are 51 or older, have a CBE every year. Also consider having a breast X-ray (mammogram) every year.  If you have a family history of breast cancer, talk to your health care provider about genetic screening.  If you are at high risk for breast cancer, talk to your health care provider about having an MRI and a mammogram every year.  Breast cancer gene (BRCA) assessment is recommended for women who have family members with BRCA-related cancers. BRCA-related cancers include:  Breast.  Ovarian.  Tubal.  Peritoneal  cancers.  Results of the assessment will determine the need for genetic counseling and BRCA1 and BRCA2 testing. Cervical Cancer Your health care provider may recommend that you be screened regularly for cancer of the pelvic organs (ovaries, uterus, and vagina). This screening involves a pelvic examination, including checking for microscopic changes to the surface of your cervix (Pap test). You may be encouraged to have this screening done every 3 years, beginning at age 80.  For women ages 77-65, health care providers may recommend pelvic exams and Pap testing every 3 years, or they may recommend the Pap and pelvic exam, combined with testing for human papilloma virus (HPV), every 5 years. Some types of HPV increase your risk of cervical cancer. Testing for HPV may also be done on women of any age with unclear Pap test results.  Other health care providers may not recommend any screening for nonpregnant women who are considered low risk for pelvic cancer and who do not have symptoms. Ask your health care provider if a screening pelvic exam is right for you.  If you have had past treatment for cervical cancer or a condition that could lead to cancer, you need Pap tests and screening for cancer for at least 20 years after your treatment. If Pap tests have been discontinued, your risk factors (such as having a new sexual partner) need to be reassessed to determine if screening should resume. Some women have medical problems that increase the chance of getting cervical cancer. In these cases, your health care provider may recommend more frequent screening and Pap tests. Colorectal Cancer  This type of cancer can be detected and often prevented.  Routine colorectal cancer screening usually begins at 42 years of age and continues through 42 years of age.  Your health care provider may recommend screening at an earlier age if you have risk factors for colon cancer.  Your health care provider may also  recommend using home test kits to check for hidden blood in the stool.  A small camera at the end of a tube can be used to examine your colon directly (sigmoidoscopy or colonoscopy). This is done to check for the earliest forms of colorectal cancer.  Routine screening usually begins at age 2.  Direct examination of the colon should be repeated every 5-10 years through 42 years of age. However, you may need to be screened more often if early forms of precancerous polyps or small growths are found. Skin Cancer  Check your skin from head to toe regularly.  Tell your health care provider about any new moles  or changes in moles, especially if there is a change in a mole's shape or color.  Also tell your health care provider if you have a mole that is larger than the size of a pencil eraser.  Always use sunscreen. Apply sunscreen liberally and repeatedly throughout the day.  Protect yourself by wearing long sleeves, pants, a wide-brimmed hat, and sunglasses whenever you are outside. HEART DISEASE, DIABETES, AND HIGH BLOOD PRESSURE   High blood pressure causes heart disease and increases the risk of stroke. High blood pressure is more likely to develop in:  People who have blood pressure in the high end of the normal range (130-139/85-89 mm Hg).  People who are overweight or obese.  People who are African American.  If you are 36-83 years of age, have your blood pressure checked every 3-5 years. If you are 45 years of age or older, have your blood pressure checked every year. You should have your blood pressure measured twice--once when you are at a hospital or clinic, and once when you are not at a hospital or clinic. Record the average of the two measurements. To check your blood pressure when you are not at a hospital or clinic, you can use:  An automated blood pressure machine at a pharmacy.  A home blood pressure monitor.  If you are between 60 years and 33 years old, ask your health  care provider if you should take aspirin to prevent strokes.  Have regular diabetes screenings. This involves taking a blood sample to check your fasting blood sugar level.  If you are at a normal weight and have a low risk for diabetes, have this test once every three years after 42 years of age.  If you are overweight and have a high risk for diabetes, consider being tested at a younger age or more often. PREVENTING INFECTION  Hepatitis B  If you have a higher risk for hepatitis B, you should be screened for this virus. You are considered at high risk for hepatitis B if:  You were born in a country where hepatitis B is common. Ask your health care provider which countries are considered high risk.  Your parents were born in a high-risk country, and you have not been immunized against hepatitis B (hepatitis B vaccine).  You have HIV or AIDS.  You use needles to inject street drugs.  You live with someone who has hepatitis B.  You have had sex with someone who has hepatitis B.  You get hemodialysis treatment.  You take certain medicines for conditions, including cancer, organ transplantation, and autoimmune conditions. Hepatitis C  Blood testing is recommended for:  Everyone born from [birth year] through 1967.  Anyone with known risk factors for hepatitis C. Sexually transmitted infections (STIs)  You should be screened for sexually transmitted infections (STIs) including gonorrhea and chlamydia if:  You are sexually active and are younger than 42 years of age.  You are older than 42 years of age and your health care provider tells you that you are at risk for this type of infection.  Your sexual activity has changed since you were last screened and you are at an increased risk for chlamydia or gonorrhea. Ask your health care provider if you are at risk.  If you do not have HIV, but are at risk, it may be recommended that you take a prescription medicine daily to prevent HIV  infection. This is called pre-exposure prophylaxis (PrEP). You are considered at risk if:  You are sexually active and do not regularly use condoms or know the HIV status of your partner(s).  You take drugs by injection.  You are sexually active with a partner who has HIV. Talk with your health care provider about whether you are at high risk of being infected with HIV. If you choose to begin PrEP, you should first be tested for HIV. You should then be tested every 3 months for as long as you are taking PrEP.  PREGNANCY   If you are premenopausal and you may become pregnant, ask your health care provider about preconception counseling.  If you may become pregnant, take 400 to 800 micrograms (mcg) of folic acid every day.  If you want to prevent pregnancy, talk to your health care provider about birth control (contraception). OSTEOPOROSIS AND MENOPAUSE   Osteoporosis is a disease in which the bones lose minerals and strength with aging. This can result in serious bone fractures. Your risk for osteoporosis can be identified using a bone density scan.  If you are 41 years of age or older, or if you are at risk for osteoporosis and fractures, ask your health care provider if you should be screened.  Ask your health care provider whether you should take a calcium or vitamin D supplement to lower your risk for osteoporosis.  Menopause may have certain physical symptoms and risks.  Hormone replacement therapy may reduce some of these symptoms and risks. Talk to your health care provider about whether hormone replacement therapy is right for you.  HOME CARE INSTRUCTIONS   Schedule regular health, dental, and eye exams.  Stay current with your immunizations.   Do not use any tobacco products including cigarettes, chewing tobacco, or electronic cigarettes.  If you are pregnant, do not drink alcohol.  If you are breastfeeding, limit how much and how often you drink alcohol.  Limit  alcohol intake to no more than 1 drink per day for nonpregnant women. One drink equals 12 ounces of beer, 5 ounces of wine, or 1 ounces of hard liquor.  Do not use street drugs.  Do not share needles.  Ask your health care provider for help if you need support or information about quitting drugs.  Tell your health care provider if you often feel depressed.  Tell your health care provider if you have ever been abused or do not feel safe at home.   This information is not intended to replace advice given to you by your health care provider. Make sure you discuss any questions you have with your health care provider.   Document Released: 04/28/2011 Document Revised: 11/03/2014 Document Reviewed: 09/14/2013 Elsevier Interactive Patient Education [employer].

## 2015-11-26 ENCOUNTER — Telehealth: Payer: Self-pay | Admitting: Gynecology

## 2015-11-26 LAB — CYTOLOGY - PAP

## 2015-11-26 NOTE — Telephone Encounter (Signed)
11/26/15-I spoke w/pt today to let her know that her [facility] will cover the replacement of her Mirena for contraception at 100%, no copay. Per Carly at [employer]

## 2015-11-28 ENCOUNTER — Other Ambulatory Visit: Payer: Self-pay | Admitting: Women's Health

## 2015-11-28 MED ORDER — FLUCONAZOLE 150 MG PO TABS: 150.0000 mg | ORAL_TABLET | Freq: Once | ORAL | Status: DC

## 2015-11-30 ENCOUNTER — Encounter: Payer: Self-pay | Admitting: Gynecology

## 2015-11-30 ENCOUNTER — Ambulatory Visit (INDEPENDENT_AMBULATORY_CARE_PROVIDER_SITE_OTHER): Payer: BLUE CROSS/BLUE SHIELD | Admitting: Gynecology

## 2015-11-30 VITALS — BP 110/70

## 2015-11-30 DIAGNOSIS — Z30433 Encounter for removal and reinsertion of intrauterine contraceptive device: Secondary | ICD-10-CM

## 2015-11-30 HISTORY — PX: INTRAUTERINE DEVICE (IUD) INSERTION: SHX5877

## 2015-11-30 NOTE — Progress Notes (Signed)
Patient presents for Mirena IUD removal and replacement. She has read through the booklet, has no contraindications and signed the consent form.  I reviewed the removal and insertional process with her as well as the risks to include infection, either immediate or long-term, uterine perforation or migration requiring surgery to remove, other complications such as pain, hormonal side effects, infertility and possibility of failure with subsequent pregnancy.   Exam with [name] assistant Pelvic: External BUS vagina normal. Cervix normal with IUD string visualized. Uterus anteverted normal size shape contour midline mobile nontender. Adnexa without masses or tenderness.  Procedure: The cervix visualized with a speculum and the old Mirena IUD string was grasped with the [facility] forcep and the IUD was removed, shown to the patient and discarded. The cervix was cleansed with Betadine, anterior lip grasped with a single-tooth tenaculum, the uterus was sounded and a Mirena IUD was placed according to manufacturer's recommendations without difficulty. The strings were trimmed. The patient tolerated well and will follow up in one month for a postinsertional check.  Lot number:  [id number]    Anastasio Auerbach MD, 2:48 PM 11/30/2015

## 2015-11-30 NOTE — Patient Instructions (Signed)
Intrauterine Device Insertion Most often, an intrauterine device (IUD) is inserted into the uterus to prevent pregnancy. There are 2 types of IUDs available:  Copper IUD--This type of IUD creates an environment that is not favorable to sperm survival. The mechanism of action of the copper IUD is not known for certain. It can stay in place for 10 years.  Hormone IUD--This type of IUD contains the hormone progestin (synthetic progesterone). The progestin thickens the cervical mucus and prevents sperm from entering the uterus, and it also thins the uterine lining. There is no evidence that the hormone IUD prevents implantation. One hormone IUD can stay in place for up to 5 years, and a different hormone IUD can stay in place for up to 3 years. An IUD is the most cost-effective birth control if left in place for the full duration. It may be removed at any time. LET [facility] CARE PROVIDER KNOW ABOUT:  Any allergies you have.  All medicines you are taking, including vitamins, herbs, eye drops, creams, and over-the-counter medicines.  Previous problems you or members of your family have had with the use of anesthetics.  Any blood disorders you have.  Previous surgeries you have had.  Possibility of pregnancy.  Medical conditions you have. RISKS AND COMPLICATIONS  Generally, intrauterine device insertion is a safe procedure. However, as with any procedure, complications can occur. Possible complications include:  Accidental puncture (perforation) of the uterus.  Accidental placement of the IUD either in the muscle layer of the uterus (myometrium) or outside the uterus. If this happens, the IUD can be found essentially floating around the bowels and must be taken out surgically.  The IUD may fall out of the uterus (expulsion). This is more common in women who have recently had a child.   Pregnancy in the fallopian tube (ectopic).  Pelvic inflammatory disease (PID), which is infection of  the uterus and fallopian tubes. The risk of PID is slightly increased in the first 20 days after the IUD is placed, but the overall risk is still very low. BEFORE THE PROCEDURE  Schedule the IUD insertion for when you will have your menstrual period or right after, to make sure you are not pregnant. Placement of the IUD is better tolerated shortly after a menstrual cycle.  You may need to take tests or be examined to make sure you are not pregnant.  You may be required to take a pregnancy test.  You may be required to get checked for sexually transmitted infections (STIs) prior to placement. Placing an IUD in someone who has an infection can make the infection worse.  You may be given a pain reliever to take 1 or 2 hours before the procedure.  An exam will be performed to determine the size and position of your uterus.  Ask your health care provider about changing or stopping your regular medicines. PROCEDURE   A tool (speculum) is placed in the vagina. This allows your health care provider to see the lower part of the uterus (cervix).  The cervix is prepped with a medicine that lowers the risk of infection.  You may be given a medicine to numb each side of the cervix (intracervical or paracervical block). This is used to block and control any discomfort with insertion.  A tool (uterine sound) is inserted into the uterus to determine the length of the uterine cavity and the direction the uterus may be tilted.  A slim instrument (IUD inserter) is inserted through the cervical  canal and into your uterus.  The IUD is placed in the uterine cavity and the insertion device is removed.  The nylon string that is attached to the IUD and used for eventual IUD removal is trimmed. It is trimmed so that it lays high in the vagina, just outside the cervix. AFTER THE PROCEDURE  You may have bleeding after the procedure. This is normal. It varies from light spotting for a few days to menstrual-like  bleeding.  You may have mild cramping.   This information is not intended to replace advice given to you by your health care provider. Make sure you discuss any questions you have with your health care provider.   Document Released: 06/11/2011 Document Revised: 08/03/2013 Document Reviewed: 04/03/2013 Elsevier Interactive Patient Education [employer].

## 2015-12-03 ENCOUNTER — Encounter: Payer: Self-pay | Admitting: Gynecology

## 2015-12-28 ENCOUNTER — Encounter: Payer: Self-pay | Admitting: Gynecology

## 2015-12-28 ENCOUNTER — Ambulatory Visit (INDEPENDENT_AMBULATORY_CARE_PROVIDER_SITE_OTHER): Payer: BLUE CROSS/BLUE SHIELD | Admitting: Gynecology

## 2015-12-28 VITALS — BP 114/66

## 2015-12-28 DIAGNOSIS — Z30431 Encounter for routine checking of intrauterine contraceptive device: Secondary | ICD-10-CM | POA: Diagnosis not present

## 2015-12-28 NOTE — Patient Instructions (Signed)
Follow up in one year when due for annual exam, sooner if any issues

## 2015-12-28 NOTE — Progress Notes (Signed)
[name] [birth year] [id number]        40 y.o.  G1P1 Presents for IUD checkup.  Had Mirena IUD replaced 11/30/2015. Has done well without complaints.  Past medical history,surgical history, problem list, medications, allergies, family history and social history were all reviewed and documented in the EPIC chart.  Directed ROS with pertinent positives and negatives documented in the history of present illness/assessment and plan.  Exam: [name] assistant Filed Vitals:   12/28/15 1322  BP: 114/66   General appearance:  Normal Abdomen soft nontender without masses guarding rebound Pelvic external BUS vagina normal. Cervix normal. IUD string visualized an appropriate length. Uterus anteverted normal size midline mobile nontender. Adnexa without masses or tenderness.  Assessment/Plan:  40 y.o. G1P1 was normal postinsertional IUD checkup. See me she continues well then she will follow up in one year for annual exam, sooner if any issues.    Stacy Auerbach MD, 1:33 PM 12/28/2015

## 2018-07-26 NOTE — Progress Notes (Signed)
[name] [place], [place] [address] Phone: [phone] Subjective:    I [name] am serving as a Education administrator for Dr. Hulan Saas.   I'm seeing this patient by the request  of:  Carol Ada, MD   CC: low back pain   UMP:[id number]  [name] is a 42 y.o. female coming in with complaint of low back pain. Pain in the morning and while she is sleeping for the past 4 weeks. No injury noted. Takes about an hour for her back pain to go away in the morning. Exercises and stretches. No numbness and tingling noted. Developing plantar fascia in the right foot. Believes the pain is muscular. Plays tennis. Has been playing more than usual recently.   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  Attempted a right-sided injection in the hip December 12, 2014.  Past Medical History:  Diagnosis Date  . Anxiety    situational  . Heart murmur    Past Surgical History:  Procedure Laterality Date  . CESAREAN SECTION  2006   twins  . INTRAUTERINE DEVICE (IUD) INSERTION  11/30/2015   Mirena   Social History   Socioeconomic History  . Marital status: Married    Spouse name: Not on file  . Number of children: Not on file  . Years of education: Not on file  . Highest education level: Not on file  Occupational History  . Not on file  Social Needs  . Financial resource strain: Not on file  . Food insecurity:    Worry: Not on file    Inability: Not on file  . Transportation needs:    Medical: Not on file    Non-medical: Not on file  Tobacco Use  . Smoking status: Never Smoker  . Smokeless tobacco: Never Used  Substance and Sexual Activity  . Alcohol use: No  . Drug use:  No  . Sexual activity: Yes    Birth control/protection: IUD    Comment: Mirena 11/30/2015  Lifestyle  . Physical activity:    Days per week: Not on file    Minutes per session: Not on file  . Stress: Not on file  Relationships  . Social connections:    Talks on phone: Not on file    Gets together: Not on file    Attends religious service: Not on file    Active member of club or organization: Not on file    Attends meetings of clubs or organizations: Not on file    Relationship status: Not on file  Other Topics Concern  . Not on file  Social History Narrative  . Not on file   No Known Allergies Family History  Problem Relation Age of Onset  . Hypertension Mother   . Hypertension Father   . Heart attack Father 56  . Breast cancer Paternal Aunt   . Diabetes Maternal Grandfather   . Heart disease Paternal Grandfather   . Cancer Paternal Grandfather        skin    Current Outpatient Medications (Endocrine & Metabolic):  .  levonorgestrel (MIRENA) 20 MCG/24HR IUD, 1 each by Intrauterine route once. Reported on 11/23/2015    Current Outpatient Medications (Analgesics):  .  meloxicam (MOBIC) 15 MG tablet, Take 1 tablet (15 mg total) by mouth daily.      Past medical history, social, surgical and family history all reviewed in electronic medical record.  No pertanent information unless stated regarding to the chief complaint.   Review of Systems:  No headache, visual changes, nausea, vomiting, diarrhea, constipation, dizziness, abdominal pain, skin rash, fevers, chills, night sweats, weight loss, swollen lymph nodes, body aches, joint swelling,  chest pain, shortness of breath, mood changes.  Positive muscle aches  Objective  Blood pressure 102/80, pulse 61, height 5' 6"  (1.676 m), weight 129 lb (58.5 kg), SpO2 98 %.   General: No apparent distress alert and oriented x3 mood and affect normal, dressed appropriately.  HEENT: Pupils equal, extraocular movements intact    Respiratory: Patient's speak in full sentences and does not appear short of breath  Cardiovascular: No lower extremity edema, non tender, no erythema  Skin: Warm dry intact with no signs of infection or rash on extremities or on axial skeleton.  Abdomen: Soft nontender  Neuro: Cranial nerves II through XII are intact, neurovascularly intact in all extremities with 2+ DTRs and 2+ pulses.  Lymph: No lymphadenopathy of posterior or anterior cervical chain or axillae bilaterally.  Gait normal with good balance and coordination.  MSK:  Non tender with full range of motion and good stability and symmetric strength and tone of shoulders, elbows, wrist, hip, knee and ankles bilaterally.  Back Exam:  Inspection: Mild loss of lordosis Motion: Flexion 45 deg, Extension 25 deg, Side Bending to 45 deg bilaterally,  Rotation to 45 deg bilaterally  SLR laying: Negative  XSLR laying: Negative  Palpable tenderness: Over the left trapezius as well as the left piriformis. FABER: Left. Sensory change: Gross sensation intact to all lumbar and sacral dermatomes.  Reflexes: 2+ at both patellar tendons, 2+ at achilles tendons, Babinski's downgoing.  Strength at foot  Plantar-flexion: 5/5 Dorsi-flexion: 5/5 Eversion: 5/5 Inversion: 5/5  Leg strength  Quad: 5/5 Hamstring: 5/5 Hip flexor: 5/5 Hip abductors: 4/5  Gait unremarkable.  Osteopathic findings  C5 flexed rotated and side bent left T7 extended rotated and side bent right inhaled rib T9 extended rotated and side bent left L2 flexed rotated and side bent right Sacrum left on left    97110; 15 additional minutes spent for Therapeutic exercises as stated in above notes.  This included exercises focusing on stretching, strengthening, with significant focus on eccentric aspects.   Long term goals include an improvement in range of motion, strength, endurance as well as avoiding reinjury. Patient's frequency would include in 1-2 times a day, 3-5 times a  week for a duration of 6-12 weeks. Low back exercises that included:  Pelvic tilt/bracing instruction to focus on control of the pelvic girdle and lower abdominal muscles  Glute strengthening exercises, focusing on proper firing of the glutes without engaging the low back muscles Proper stretching techniques for maximum relief for the hamstrings, hip flexors, low back and some rotation where tolerated  Proper technique shown and discussed handout in great detail with ATC.  All questions were discussed and answered.     Impression and Recommendations:     This case required medical decision making of moderate complexity. The above documentation has been reviewed and is accurate and complete Stacy Pulley, DO       Note: This dictation was prepared with Dragon dictation along with smaller phrase technology. Any transcriptional errors that result from this process are unintentional.

## 2018-07-27 ENCOUNTER — Ambulatory Visit (INDEPENDENT_AMBULATORY_CARE_PROVIDER_SITE_OTHER)
Admission: RE | Admit: 2018-07-27 | Discharge: 2018-07-27 | Disposition: A | Discharge status: Home / Self Care | Payer: BLUE CROSS/BLUE SHIELD | Source: Ambulatory Visit | Attending: Family Medicine | Admitting: Family Medicine

## 2018-07-27 ENCOUNTER — Ambulatory Visit (INDEPENDENT_AMBULATORY_CARE_PROVIDER_SITE_OTHER): Payer: BLUE CROSS/BLUE SHIELD | Admitting: Family Medicine

## 2018-07-27 ENCOUNTER — Encounter: Payer: Self-pay | Admitting: Family Medicine

## 2018-07-27 VITALS — BP 102/80 | HR 61 | Ht 66.0 in | Wt 129.0 lb

## 2018-07-27 DIAGNOSIS — M533 Sacrococcygeal disorders, not elsewhere classified: Secondary | ICD-10-CM

## 2018-07-27 DIAGNOSIS — M545 Low back pain, unspecified: Secondary | ICD-10-CM

## 2018-07-27 DIAGNOSIS — M9908 Segmental and somatic dysfunction of rib cage: Secondary | ICD-10-CM

## 2018-07-27 DIAGNOSIS — M9901 Segmental and somatic dysfunction of cervical region: Secondary | ICD-10-CM

## 2018-07-27 DIAGNOSIS — M9904 Segmental and somatic dysfunction of sacral region: Secondary | ICD-10-CM

## 2018-07-27 DIAGNOSIS — M9903 Segmental and somatic dysfunction of lumbar region: Secondary | ICD-10-CM

## 2018-07-27 DIAGNOSIS — M999 Biomechanical lesion, unspecified: Secondary | ICD-10-CM | POA: Diagnosis not present

## 2018-07-27 DIAGNOSIS — M9902 Segmental and somatic dysfunction of thoracic region: Secondary | ICD-10-CM

## 2018-07-27 MED ORDER — MELOXICAM 15 MG PO TABS: 15.0000 mg | ORAL_TABLET | Freq: Every day | ORAL | 0 refills | Status: DC

## 2018-07-27 NOTE — Patient Instructions (Addendum)
Good to see you.  xrays downstairs Ice 20 minutes 2 times daily. Usually after activity and before bed. pennsaid pinkie amount topically 2 times daily as needed.  Meloxicam daily for 10 days then as needed   Over the counter vitamin D 2000 IU daily Turmeric 565m twice daily  Iron 649mwith 50020mf vitamin C daily  pennsaid pinkie amount topically 2 times daily as needed.   See me again in 4-6 weeks

## 2018-07-27 NOTE — Assessment & Plan Note (Signed)
Decision today to treat with OMT was based on Physical Exam  After verbal consent patient was treated with HVLA, ME, FPR techniques in cervical, thoracic, rib, lumbar and sacral areas  Patient tolerated the procedure well with improvement in symptoms  Patient given exercises, stretches and lifestyle modifications  See medications in patient instructions if given  Patient will follow up in 4-6 weeks

## 2018-07-27 NOTE — Assessment & Plan Note (Signed)
Dysfunction noted.  Discussed icing regimen and home exercises topical anti-inflammatories.  Discussed hip abductor strengthening.  Work with Product/process development scientist.  Meloxicam given.  Patient did switch mattresses recently that could hopefully make this more beneficial.  Discussed over-the-counter medications.  Follow-up again in 4 weeks

## 2018-08-16 ENCOUNTER — Encounter: Payer: Self-pay | Admitting: Family Medicine

## 2018-08-30 ENCOUNTER — Ambulatory Visit: Payer: BLUE CROSS/BLUE SHIELD | Admitting: Family Medicine

## 2019-01-06 ENCOUNTER — Other Ambulatory Visit: Payer: Self-pay | Admitting: Family Medicine

## 2019-01-07 ENCOUNTER — Telehealth (HOSPITAL_COMMUNITY): Payer: Self-pay

## 2019-01-07 NOTE — Telephone Encounter (Signed)
Returned patient's phone call. Left name and number for her to call back.

## 2019-07-19 ENCOUNTER — Encounter: Payer: Self-pay | Admitting: Gynecology

## 2019-08-08 ENCOUNTER — Other Ambulatory Visit: Payer: Self-pay | Admitting: Gynecology

## 2019-08-08 DIAGNOSIS — Z1231 Encounter for screening mammogram for malignant neoplasm of breast: Secondary | ICD-10-CM

## 2019-09-26 ENCOUNTER — Ambulatory Visit
Admission: RE | Admit: 2019-09-26 | Discharge: 2019-09-26 | Disposition: A | Discharge status: Home / Self Care | Payer: No Typology Code available for payment source | Source: Ambulatory Visit | Attending: Gynecology | Admitting: Gynecology

## 2019-09-26 ENCOUNTER — Other Ambulatory Visit: Payer: Self-pay

## 2019-09-26 DIAGNOSIS — Z1231 Encounter for screening mammogram for malignant neoplasm of breast: Secondary | ICD-10-CM

## 2019-09-27 ENCOUNTER — Other Ambulatory Visit: Payer: Self-pay | Admitting: Gynecology

## 2019-09-27 DIAGNOSIS — R928 Other abnormal and inconclusive findings on diagnostic imaging of breast: Secondary | ICD-10-CM

## 2019-10-04 ENCOUNTER — Ambulatory Visit
Admission: RE | Admit: 2019-10-04 | Discharge: 2019-10-04 | Disposition: A | Discharge status: Home / Self Care | Payer: No Typology Code available for payment source | Source: Ambulatory Visit | Attending: Gynecology | Admitting: Gynecology

## 2019-10-04 ENCOUNTER — Ambulatory Visit
Admission: RE | Admit: 2019-10-04 | Discharge: 2019-10-04 | Disposition: A | Discharge status: Home / Self Care | Payer: Self-pay | Source: Ambulatory Visit | Attending: Gynecology | Admitting: Gynecology

## 2019-10-04 ENCOUNTER — Other Ambulatory Visit: Payer: Self-pay

## 2019-10-04 DIAGNOSIS — R928 Other abnormal and inconclusive findings on diagnostic imaging of breast: Secondary | ICD-10-CM

## 2019-11-01 IMAGING — DX DG LUMBAR SPINE BEND(FLEX/EXT) ONLY 2-3 V
3 series · 3 of 3 positions shown · non-contrast
Comparison: None.

CLINICAL DATA: Chronic low back pain worsening over the past 4
weeks. No injury.

EXAM:
LUMBAR SPINE FLEX AND EXTEND ONLY - 2-3 VIEW

[l-spine lat]
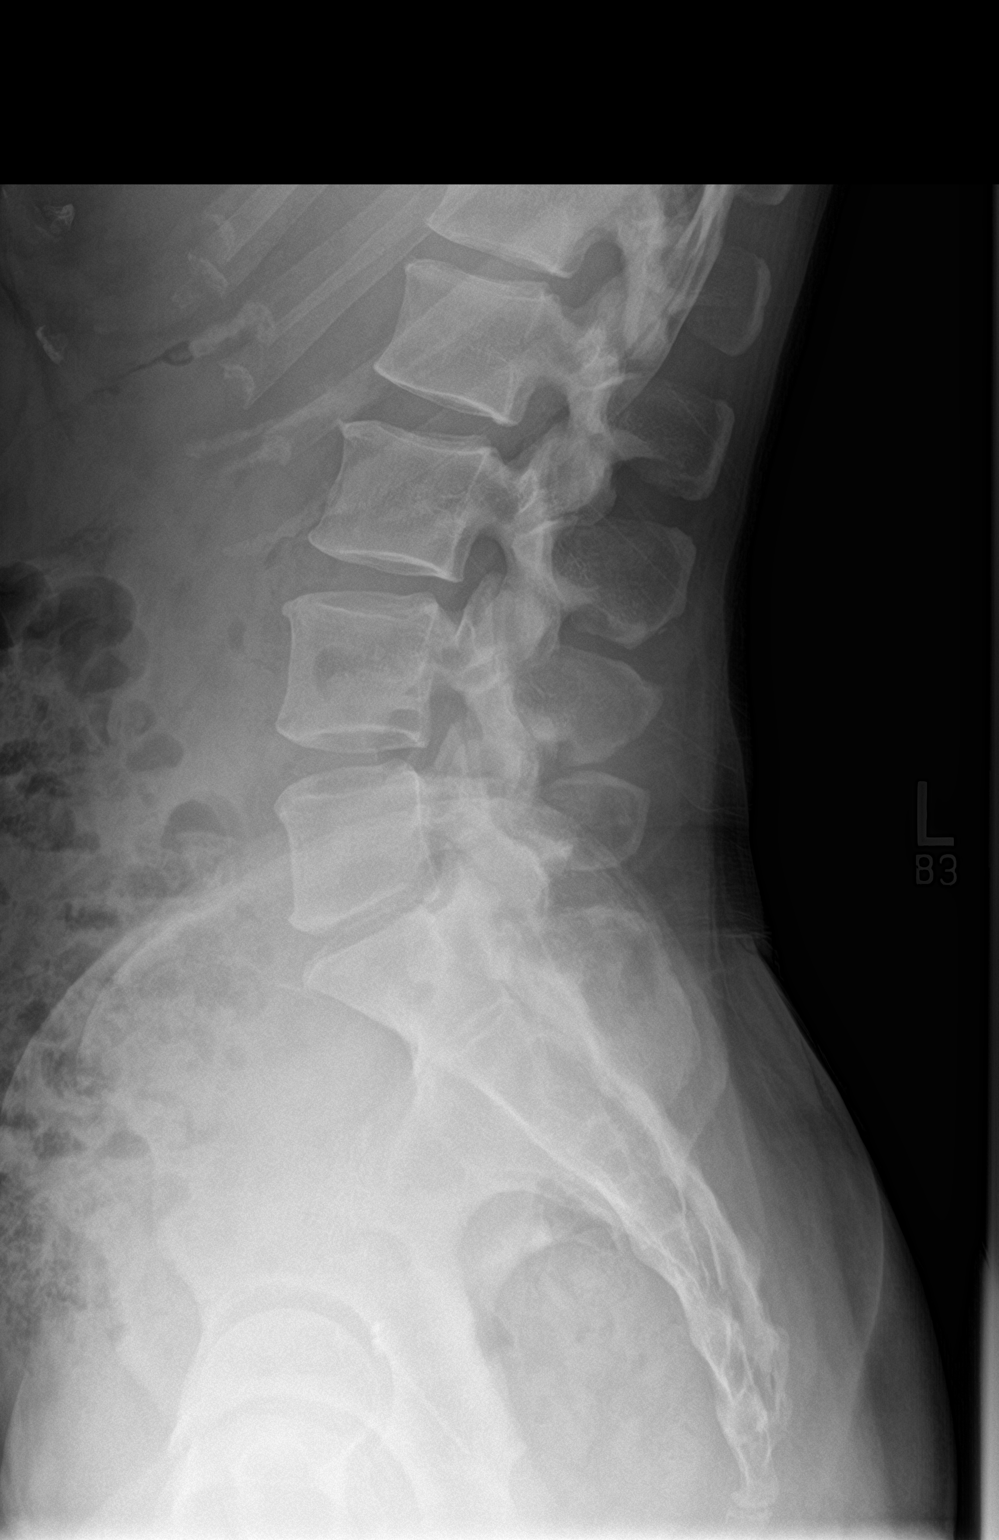

[l-spine flex]
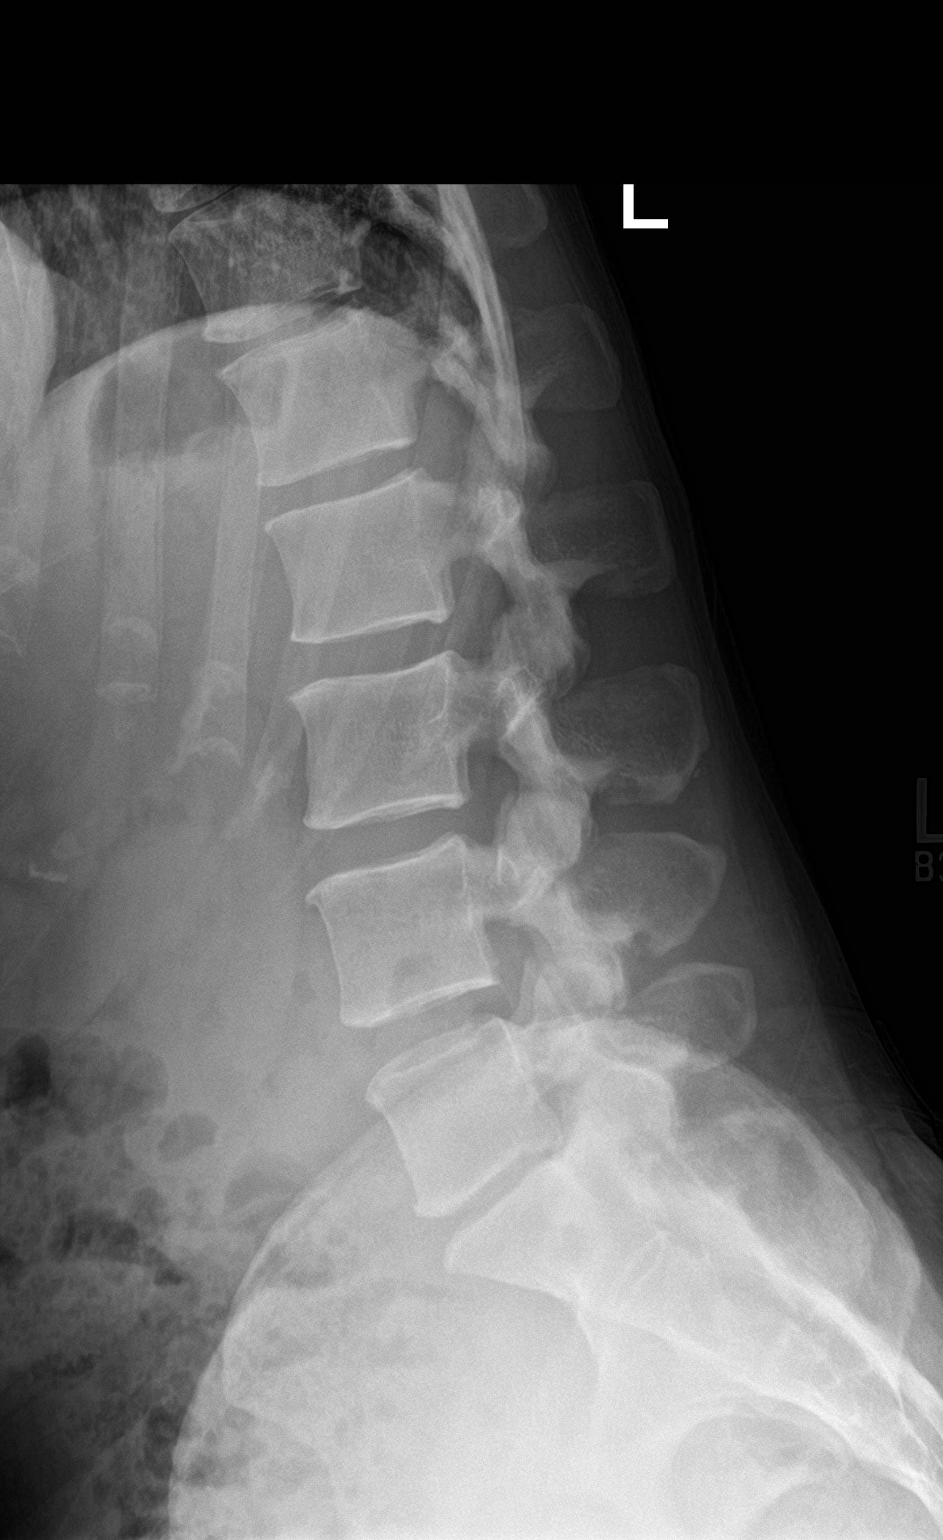

[l-spine ext]
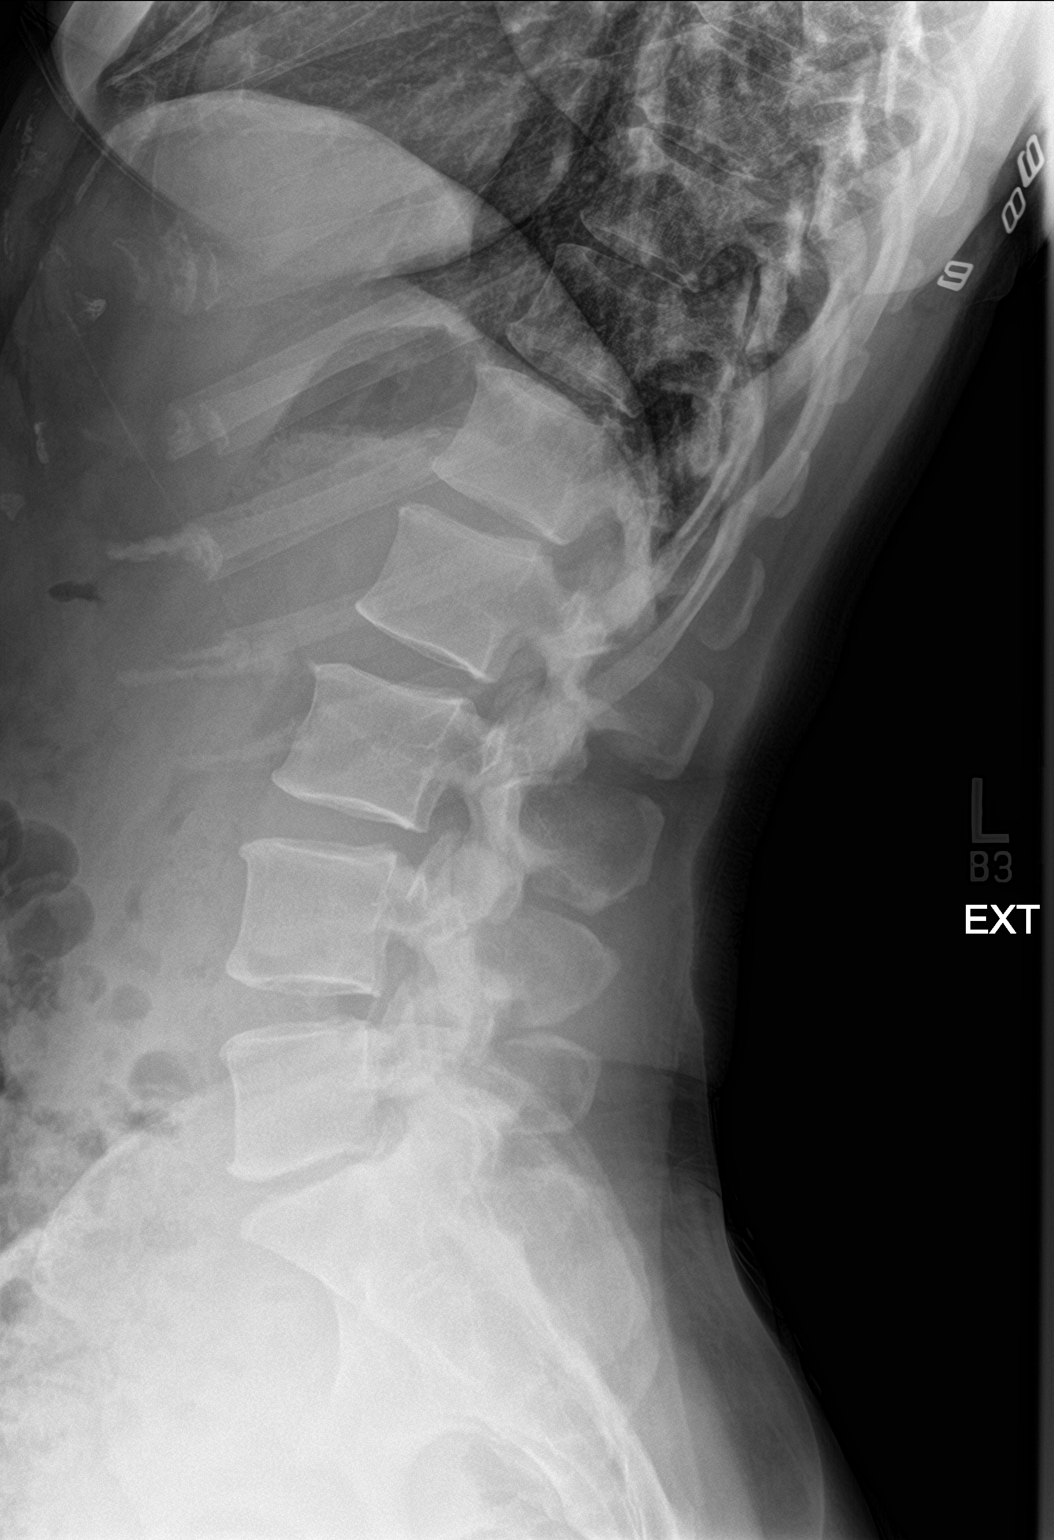

[3 of 3 positions shown; findings below may reference images not displayed]

FINDINGS: Vertebral body alignment and heights are normal. There is mild
spondylosis of the lumbar spine. Mild facet arthropathy over the
lower lumbar spine. Minimal disc space narrowing at the L5-S1 level.
No evidence of spondylolisthesis. No evidence of instability on
flexion and extension. No compression fracture.
IMPRESSION: No acute findings.

Mild spondylosis of the lumbar spine without evidence of
spondylolisthesis or instability.

## 2020-01-20 ENCOUNTER — Ambulatory Visit: Payer: Self-pay | Attending: Internal Medicine

## 2020-01-20 DIAGNOSIS — Z23 Encounter for immunization: Secondary | ICD-10-CM

## 2020-01-20 NOTE — Progress Notes (Signed)
   Covid-19 Vaccination Clinic  Name:  [name]    MRN: [id number] DOB: 06-24-1976  01/20/2020  [name] was observed post Covid-19 immunization for 15 minutes without incident. She was provided with Vaccine Information Sheet and instruction to access the V-Safe system.   [name] was instructed to call 911 with any severe reactions post vaccine: Marland Kitchen Difficulty breathing  . Swelling of face and throat  . A fast heartbeat  . A bad rash all over body  . Dizziness and weakness   Immunizations Administered    Name Date Dose VIS Date Route   Pfizer COVID-19 Vaccine 01/20/2020  9:18 AM 0.3 mL 10/07/2019 Intramuscular   Manufacturer: [place]   Lot: [id number]   [place]: [address]-1

## 2020-02-14 ENCOUNTER — Ambulatory Visit: Payer: Self-pay | Attending: Internal Medicine

## 2020-02-14 DIAGNOSIS — Z23 Encounter for immunization: Secondary | ICD-10-CM

## 2020-02-14 NOTE — Progress Notes (Signed)
   Covid-19 Vaccination Clinic  Name:  [name]    MRN: [id number] DOB: 1976/07/24  02/14/2020  [name] was observed post Covid-19 immunization for 15 minutes without incident. She was provided with Vaccine Information Sheet and instruction to access the V-Safe system.   [name] was instructed to call 911 with any severe reactions post vaccine: Marland Kitchen Difficulty breathing  . Swelling of face and throat  . A fast heartbeat  . A bad rash all over body  . Dizziness and weakness   Immunizations Administered    Name Date Dose VIS Date Route   Pfizer COVID-19 Vaccine 02/14/2020  9:10 AM 0.3 mL 12/21/2018 Intramuscular   Manufacturer: [place]   Lot: [id number]   [place]: [address]-1

## 2022-01-03 ENCOUNTER — Other Ambulatory Visit: Payer: Self-pay

## 2022-01-03 ENCOUNTER — Ambulatory Visit (INDEPENDENT_AMBULATORY_CARE_PROVIDER_SITE_OTHER): Payer: Managed Care, Other (non HMO) | Admitting: Medical-Surgical

## 2022-01-03 ENCOUNTER — Encounter: Payer: Self-pay | Admitting: Medical-Surgical

## 2022-01-03 VITALS — BP 100/66 | HR 59 | Resp 20 | Ht 66.0 in | Wt 135.1 lb

## 2022-01-03 DIAGNOSIS — Z7689 Persons encountering health services in other specified circumstances: Secondary | ICD-10-CM

## 2022-01-03 DIAGNOSIS — B001 Herpesviral vesicular dermatitis: Secondary | ICD-10-CM

## 2022-01-03 DIAGNOSIS — D225 Melanocytic nevi of trunk: Secondary | ICD-10-CM | POA: Insufficient documentation

## 2022-01-03 DIAGNOSIS — Z23 Encounter for immunization: Secondary | ICD-10-CM | POA: Diagnosis not present

## 2022-01-03 DIAGNOSIS — Z87898 Personal history of other specified conditions: Secondary | ICD-10-CM | POA: Insufficient documentation

## 2022-01-03 DIAGNOSIS — L821 Other seborrheic keratosis: Secondary | ICD-10-CM | POA: Insufficient documentation

## 2022-01-03 DIAGNOSIS — L814 Other melanin hyperpigmentation: Secondary | ICD-10-CM | POA: Insufficient documentation

## 2022-01-03 DIAGNOSIS — D2261 Melanocytic nevi of right upper limb, including shoulder: Secondary | ICD-10-CM | POA: Insufficient documentation

## 2022-01-03 DIAGNOSIS — D2370 Other benign neoplasm of skin of unspecified lower limb, including hip: Secondary | ICD-10-CM | POA: Insufficient documentation

## 2022-01-03 MED ORDER — VALACYCLOVIR HCL 1 G PO TABS: 1000.0000 mg | ORAL_TABLET | Freq: Two times a day (BID) | ORAL | 0 refills | Status: AC | PRN

## 2022-01-03 NOTE — Progress Notes (Signed)
? ?New Patient Office Visit ? ?Subjective:  ?Patient ID: [name], female    DOB: [birth year]  Age: 46 y.o. MRN: [id number] ? ?CC:  ?Chief Complaint  ?Patient presents with  ? Annual Exam  ? ? ? ?HPI ?[name] presents to establish care.  She is a very pleasant 46 year old female who is establishing a PCP as she has an upcoming hip replacement surgery planned and they will not except an OB/GYN as a primary care.  She is hoping to have surgery on her right hip for severe osteoarthritis at the end of April/early May.  She notes that she was told 7 years ago that she needed a hip replacement but she has been able to make it last this long.  She is still very active overall but is ready to get the replacement surgery completed. ? 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  She uses SPF lip balm regularly.  When she does have an outbreak, she uses Valtrex 1000 mg every 12 hours x 2 doses.  Reports maybe 5 outbreaks a year.  Would like to have a refill on Valtrex that does not require her to call in with each subsequent cold sore outbreak. ? ?Past Medical History:  ?Diagnosis Date  ? Anxiety   ? situational  ? Arthritis   ? Heart murmur   ? ? ?Past Surgical History:  ?Procedure Laterality Date  ? CESAREAN SECTION  10/27/2004  ? twins  ? INTRAUTERINE DEVICE (IUD) INSERTION  11/30/2015  ? Mirena  ? ? ?Family History  ?Problem Relation Age of Onset  ? Hypertension Father   ? Heart attack Father 63  ? Hypertension Mother   ? Varicose Veins Mother   ? Breast cancer Paternal Aunt   ? Diabetes Maternal Grandfather   ? Heart disease Paternal Grandfather   ? Cancer Paternal Grandfather   ?     skin  ? Hearing loss Paternal Grandfather   ? Stroke Paternal Grandfather   ? Anxiety disorder Maternal Grandmother   ? Depression Maternal Grandmother   ? Vision loss Paternal Grandmother   ? ? ?Social History  ? ?Socioeconomic History  ? Marital status: Married  ?  Spouse  name: Not on file  ? Number of children: Not on file  ? Years of education: Not on file  ? Highest education level: Not on file  ?Occupational History  ? Not on file  ?Tobacco Use  ? Smoking status: Never  ? Smokeless tobacco: Never  ?Substance and Sexual Activity  ? Alcohol use: No  ? Drug use: No  ? Sexual activity: Yes  ?  Birth control/protection: I.U.D.  ?  Comment: Mirena 11/30/2015  ?Other Topics Concern  ? Not on file  ?Social History Narrative  ? Not on file  ? ?Social Determinants of Health  ? ?Financial Resource Strain: Not on file  ?Food Insecurity: Not on file  ?Transportation Needs: Not on file  ?Physical Activity: Not on file  ?Stress: Not on file  ?Social Connections: Not on file  ?Intimate Partner Violence: Not on file  ? ? ?ROS ?Review of Systems  ?Constitutional:  Negative for chills, fatigue, fever and unexpected weight change.  ?Respiratory:  Negative for cough, chest tightness and shortness of breath.   ?Cardiovascular:  Negative for chest pain, palpitations and leg swelling.  ?Gastrointestinal:  Negative for abdominal pain, constipation, diarrhea, nausea and vomiting.  ?Endocrine: Negative for cold intolerance  and heat intolerance.  ?Genitourinary:  Negative for dysuria, frequency and urgency.  ?Skin:  Negative for rash and wound.  ?Neurological:  Negative for dizziness, light-headedness and headaches.  ?Hematological:  Does not bruise/bleed easily.  ?Psychiatric/Behavioral:  Negative for dysphoric mood, self-injury, sleep disturbance and suicidal ideas. The patient is not nervous/anxious.   ? ?Depression screen [facility] 2/9 01/03/2022  ?Decreased Interest 0  ?Down, Depressed, Hopeless 0  ?PHQ - 2 Score 0  ?Altered sleeping 0  ?Tired, decreased energy 0  ?Change in appetite 0  ?Feeling bad or failure about yourself  0  ?Trouble concentrating 0  ?Moving slowly or fidgety/restless 0  ?Suicidal thoughts 0  ?PHQ-9 Score 0  ?Difficult doing work/chores Not difficult at all  ? ? ?GAD 7 : Generalized Anxiety  Score 01/03/2022  ?Nervous, Anxious, on Edge 0  ?Control/stop worrying 0  ?Worry too much - different things 0  ?Trouble relaxing 0  ?Restless 0  ?Easily annoyed or irritable 0  ?Afraid - awful might happen 0  ?Total GAD 7 Score 0  ?Anxiety Difficulty Not difficult at all  ? ? ? ?Objective:  ? ?Today's Vitals: BP 100/66   Pulse (!) 59   Resp 20   Ht 5' 6"  (1.676 m)   Wt 135 lb 1.6 oz (61.3 kg)   SpO2 97%   BMI 21.81 kg/m?  ? ?Physical Exam ?Vitals reviewed.  ?Constitutional:   ?   General: She is not in acute distress. ?   Appearance: Normal appearance. She is normal weight. She is not ill-appearing.  ?HENT:  ?   Head: Normocephalic and atraumatic.  ?Cardiovascular:  ?   Rate and Rhythm: Normal rate and regular rhythm.  ?   Pulses: Normal pulses.  ?   Heart sounds: Normal heart sounds. No murmur heard. ?  No friction rub. No gallop.  ?Pulmonary:  ?   Effort: Pulmonary effort is normal. No respiratory distress.  ?   Breath sounds: Normal breath sounds. No wheezing.  ?Skin: ?   General: Skin is warm and dry.  ?Neurological:  ?   Mental Status: She is alert and oriented to person, place, and time.  ?Psychiatric:     ?   Mood and Affect: Mood normal.     ?   Behavior: Behavior normal.     ?   Thought Content: Thought content normal.     ?   Judgment: Judgment normal.  ? ? ?Assessment & Plan:  ? ?1. Encounter to establish care ?Reviewed available information and discussed care concerns with patient.  Advised her to contact her orthopedic provider and let them know that she is established a primary care.  Any presurgical work-up that is needed she be faxed over to our office so we can get those orders in place. ? ?2. Recurrent cold sores ?Valtrex 1000 mg twice daily x2 doses as needed.  Larger supply sent in so she has this on hand rather than having to wait. ? ?3. Need for Tdap vaccination ?Tdap given in office today. ?- Tdap vaccine greater than or equal to 7yo IM ? ? ?Outpatient Encounter Medications as of  01/03/2022  ?Medication Sig  ? triamcinolone cream (KENALOG) 0.1 %   ? fluocinonide (LIDEX) 0.05 % external solution   ? ibuprofen (IBU) 800 MG tablet IBU 800 mg tablet ? TAKE ONE TABLET EVERY EIGHT HOURS BY MOUTH AS NEEDED.  ? levonorgestrel (MIRENA) 20 MCG/24HR IUD 1 each by Intrauterine route once. Reported on 11/23/2015  ?  meloxicam (MOBIC) 15 MG tablet Take 1 tablet (15 mg total) by mouth daily.  ? valACYclovir (VALTREX) 1000 MG tablet Take 1 tablet (1,000 mg total) by mouth 2 (two) times daily as needed for up to 2 doses. Use as needed for cold sores  ? [DISCONTINUED] valACYclovir (VALTREX) 1000 MG tablet   ? ?No facility-administered encounter medications on file as of 01/03/2022.  ? ? ?Follow-up: Return if symptoms worsen or fail to improve.  ? ?Clearnce Sorrel, DNP, APRN, FNP-BC ?[place] ?[facility] and [facility] ? ?

## 2022-01-21 ENCOUNTER — Telehealth: Payer: Self-pay | Admitting: Medical-Surgical

## 2022-01-21 NOTE — Telephone Encounter (Signed)
Preoperative form received with surgery clearance requirements. They are requiring blood work, EKG, and a urinalysis plus a comprehensive review/physical exam. Please contact patient to get her in for a preop clearance so we can get her cleared for surgery. Ok to see any provider or use a same day slot if needed.  ? ?___________________________________________ ?Clearnce Sorrel, DNP, APRN, FNP-BC ?Primary Care and Sports Medicine ?[place] ? ?

## 2022-01-22 NOTE — Telephone Encounter (Signed)
LVM for patient to call back to get this pre-op visit scheduled. AM ?

## 2022-01-22 NOTE — Telephone Encounter (Signed)
See note, please schedule for appt.  ?

## 2022-02-12 NOTE — Progress Notes (Signed)
?HPI with pertinent ROS:  ? ?CC: preoperative clearance ? ?HPI: ?Very pleasant 46 year old female presenting today for preoperative evaluation and clearance for a right total hip arthroplasty that scheduled for May 25.  She has no other additional concerns today.  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 ? ?I reviewed the past medical history, family history, social history, surgical history, and allergies today and no changes were needed.  Please see the problem list section below in epic for further details. ? ?Review of Systems  ?Constitutional:  Negative for chills, fever, malaise/fatigue and weight loss.  ?HENT:  Negative for congestion, sinus pain and sore throat.   ?Eyes:  Negative for blurred vision and double vision.  ?Respiratory:  Negative for cough, shortness of breath and wheezing.   ?Cardiovascular:  Negative for chest pain, palpitations and leg swelling.  ?Gastrointestinal:  Negative for abdominal pain, blood in stool, constipation, diarrhea, heartburn, melena, nausea and vomiting.  ?Genitourinary:  Negative for dysuria, frequency and urgency.  ?Neurological:  Negative for dizziness, tingling, weakness and headaches.  ?Psychiatric/Behavioral:  Negative for depression and suicidal ideas. The patient is not nervous/anxious and does not have insomnia.   ? ? ?Physical exam:  ? ?Physical Exam ?Vitals reviewed.  ?Constitutional:   ?   General: She is not in acute distress. ?   Appearance: Normal appearance. She is not ill-appearing, toxic-appearing or diaphoretic.  ?HENT:  ?   Head: Normocephalic and atraumatic.  ?   Right Ear: Tympanic membrane, ear canal and external ear normal. There is no impacted cerumen.  ?   Left Ear: Tympanic membrane, ear canal and external ear normal. There is no impacted cerumen.  ?   Nose: Nose normal.  ?   Mouth/Throat:  ?   Mouth: Mucous membranes are moist.  ?Eyes:  ?    General: No scleral icterus.    ?   Right eye: No discharge.     ?   Left eye: No discharge.  ?   Extraocular Movements: Extraocular movements intact.  ?   Conjunctiva/sclera: Conjunctivae normal.  ?   Pupils: Pupils are equal, round, and reactive to light.  ?Cardiovascular:  ?   Rate and Rhythm: Normal rate and regular rhythm.  ?   Pulses: Normal pulses.  ?   Heart sounds: Normal heart sounds. No murmur heard. ?  No friction rub. No gallop.  ?Pulmonary:  ?   Effort: Pulmonary effort is normal. No respiratory distress.  ?   Breath sounds: Normal breath sounds.  ?Abdominal:  ?   General: Abdomen is flat. Bowel sounds are normal. There is no distension.  ?   Palpations: Abdomen is soft. There is no mass.  ?   Tenderness: There is no abdominal tenderness. There is no guarding or rebound.  ?   Hernia: No hernia is present.  ?Musculoskeletal:     ?   General: Normal range of motion.  ?   Cervical back: Neck supple. No tenderness.  ?Lymphadenopathy:  ?   Cervical: No cervical adenopathy.  ?Skin: ?   General: Skin is warm and dry.  ?Neurological:  ?   Mental Status: She is alert and oriented to person, place, and time.  ?Psychiatric:     ?   Mood and Affect: Mood normal.     ?   Behavior: Behavior normal.     ?  Thought Content: Thought content normal.     ?   Judgment: Judgment normal.  ? ? ?Impression and Recommendations:   ? ?1. Preoperative clearance ?Preoperative risk evaluation form reviewed.  Labs ordered as recommended.  We will with get an EKG due to her history of having a leaky valve. In office EKG- sinus bradycardia, rate 47, normal axis. This EKG is consistent with the patient's physical fitness and exercise habits. No acute changes. POCT urinalysis completed with normal results.  Benign exam with no significant abnormal findings today.  She has greater than 4 metabolic equivalents of cardiac capacity and is low risk for her upcoming surgical procedure. ?- CBC with Differential/Platelet ?- COMPLETE METABOLIC  PANEL WITH GFR ?- Hemoglobin A1c ?- CP4508-PT/INR AND PTT ?- EKG 12-Lead ?- POCT Urinalysis Dipstick ? ?Return if symptoms worsen or fail to improve. ?___________________________________________ ?Clearnce Sorrel, DNP, APRN, FNP-BC ?Primary Care and Sports Medicine ?[place] ?

## 2022-02-13 ENCOUNTER — Ambulatory Visit (INDEPENDENT_AMBULATORY_CARE_PROVIDER_SITE_OTHER): Payer: Managed Care, Other (non HMO) | Admitting: Medical-Surgical

## 2022-02-13 ENCOUNTER — Encounter: Payer: Self-pay | Admitting: Medical-Surgical

## 2022-02-13 VITALS — BP 101/66 | HR 53 | Resp 20 | Ht 66.0 in | Wt 136.1 lb

## 2022-02-13 DIAGNOSIS — Z01818 Encounter for other preprocedural examination: Secondary | ICD-10-CM

## 2022-02-13 LAB — POCT URINALYSIS DIPSTICK
Bilirubin, UA: NEGATIVE
Blood, UA: NEGATIVE
Glucose, UA: NEGATIVE
Ketones, UA: NEGATIVE
Leukocytes, UA: NEGATIVE
Nitrite, UA: NEGATIVE
Protein, UA: NEGATIVE
Spec Grav, UA: 1.025 (ref 1.010–1.025)
Urobilinogen, UA: 0.2 E.U./dL
pH, UA: 7 (ref 5.0–8.0)

## 2022-02-14 LAB — COMPLETE METABOLIC PANEL WITH GFR
AG Ratio: 2 (calc) (ref 1.0–2.5)
ALT: 8 U/L (ref 6–29)
AST: 9 U/L — ABNORMAL LOW (ref 10–35)
Albumin: 4.4 g/dL (ref 3.6–5.1)
Alkaline phosphatase (APISO): 47 U/L (ref 31–125)
BUN: 16 mg/dL (ref 7–25)
CO2: 29 mmol/L (ref 20–32)
Calcium: 9.7 mg/dL (ref 8.6–10.2)
Chloride: 105 mmol/L (ref 98–110)
Creat: 0.86 mg/dL (ref 0.50–0.99)
Globulin: 2.2 g/dL (calc) (ref 1.9–3.7)
Glucose, Bld: 92 mg/dL (ref 65–139)
Potassium: 5.6 mmol/L — ABNORMAL HIGH (ref 3.5–5.3)
Sodium: 140 mmol/L (ref 135–146)
Total Bilirubin: 0.6 mg/dL (ref 0.2–1.2)
Total Protein: 6.6 g/dL (ref 6.1–8.1)
eGFR: 84 mL/min/{1.73_m2} (ref >=60)

## 2022-02-14 LAB — CP4508-PT/INR AND PTT
INR: 1.1
Prothrombin Time: 10.7 s (ref 9.0–11.5)
aPTT: 27 s (ref 23–32)

## 2022-02-14 LAB — CBC WITH DIFFERENTIAL/PLATELET
Absolute Monocytes: 525 cells/uL (ref 200–950)
Basophils Absolute: 53 cells/uL (ref 0–200)
Basophils Relative: 0.7 %
Eosinophils Absolute: 60 cells/uL (ref 15–500)
Eosinophils Relative: 0.8 %
HCT: 42.2 % (ref 35.0–45.0)
Hemoglobin: 14.1 g/dL (ref 11.7–15.5)
Lymphs Abs: 2093 cells/uL (ref 850–3900)
MCH: 31.3 pg (ref 27.0–33.0)
MCHC: 33.4 g/dL (ref 32.0–36.0)
MCV: 93.8 fL (ref 80.0–100.0)
MPV: 9.6 fL (ref 7.5–12.5)
Monocytes Relative: 7 %
Neutro Abs: 4770 cells/uL (ref 1500–7800)
Neutrophils Relative %: 63.6 %
Platelets: 301 10*3/uL (ref 140–400)
RBC: 4.5 10*6/uL (ref 3.80–5.10)
RDW: 12.3 % (ref 11.0–15.0)
Total Lymphocyte: 27.9 %
WBC: 7.5 10*3/uL (ref 3.8–10.8)

## 2022-02-14 LAB — HEMOGLOBIN A1C
Hgb A1c MFr Bld: 5.5 % of total Hgb (ref <5.7)
Mean Plasma Glucose: 111 mg/dL
eAG (mmol/L): 6.2 mmol/L

## 2022-02-17 ENCOUNTER — Other Ambulatory Visit: Payer: Self-pay

## 2022-02-17 DIAGNOSIS — E875 Hyperkalemia: Secondary | ICD-10-CM

## 2022-02-22 LAB — COMPLETE METABOLIC PANEL WITH GFR
AG Ratio: 2 (calc) (ref 1.0–2.5)
ALT: 9 U/L (ref 6–29)
AST: 11 U/L (ref 10–35)
Albumin: 4.4 g/dL (ref 3.6–5.1)
Alkaline phosphatase (APISO): 50 U/L (ref 31–125)
BUN/Creatinine Ratio: 20 (calc) (ref 6–22)
BUN: 21 mg/dL (ref 7–25)
CO2: 27 mmol/L (ref 20–32)
Calcium: 9.9 mg/dL (ref 8.6–10.2)
Chloride: 107 mmol/L (ref 98–110)
Creat: 1.07 mg/dL — ABNORMAL HIGH (ref 0.50–0.99)
Globulin: 2.2 g/dL (calc) (ref 1.9–3.7)
Glucose, Bld: 88 mg/dL (ref 65–99)
Potassium: 5.7 mmol/L — ABNORMAL HIGH (ref 3.5–5.3)
Sodium: 140 mmol/L (ref 135–146)
Total Bilirubin: 0.4 mg/dL (ref 0.2–1.2)
Total Protein: 6.6 g/dL (ref 6.1–8.1)
eGFR: 64 mL/min/{1.73_m2} (ref >=60)

## 2022-02-26 NOTE — Telephone Encounter (Signed)
Printed the patients Office notes, labs, and EKG from 02/13/2022 and faxed to [facility]. ? ?ATTN: Orson Slick ?Fax #: [phone] ?

## 2022-12-19 DIAGNOSIS — R319 Hematuria, unspecified: Secondary | ICD-10-CM | POA: Diagnosis not present

## 2022-12-19 DIAGNOSIS — K625 Hemorrhage of anus and rectum: Secondary | ICD-10-CM | POA: Diagnosis not present

## 2022-12-19 DIAGNOSIS — K649 Unspecified hemorrhoids: Secondary | ICD-10-CM | POA: Diagnosis not present

## 2022-12-19 DIAGNOSIS — T148XXA Other injury of unspecified body region, initial encounter: Secondary | ICD-10-CM | POA: Diagnosis not present

## 2023-09-03 DIAGNOSIS — Z23 Encounter for immunization: Secondary | ICD-10-CM | POA: Diagnosis not present

## 2023-09-09 DIAGNOSIS — Z79899 Other long term (current) drug therapy: Secondary | ICD-10-CM | POA: Diagnosis not present

## 2023-10-06 DIAGNOSIS — Z30431 Encounter for routine checking of intrauterine contraceptive device: Secondary | ICD-10-CM | POA: Diagnosis not present

## 2023-10-06 DIAGNOSIS — Z6822 Body mass index (BMI) 22.0-22.9, adult: Secondary | ICD-10-CM | POA: Diagnosis not present

## 2023-10-06 DIAGNOSIS — Z124 Encounter for screening for malignant neoplasm of cervix: Secondary | ICD-10-CM | POA: Diagnosis not present

## 2023-10-06 DIAGNOSIS — Z1151 Encounter for screening for human papillomavirus (HPV): Secondary | ICD-10-CM | POA: Diagnosis not present

## 2023-10-06 DIAGNOSIS — Z01419 Encounter for gynecological examination (general) (routine) without abnormal findings: Secondary | ICD-10-CM | POA: Diagnosis not present

## 2023-10-06 DIAGNOSIS — Z1231 Encounter for screening mammogram for malignant neoplasm of breast: Secondary | ICD-10-CM | POA: Diagnosis not present
